# Patient Record
Sex: Male | Born: 1945 | Race: White | Hispanic: No | Marital: Married | Smoking: Former smoker
Health system: Southern US, Community
[De-identification: ages and names within clinical notes are randomized; demographics above are authoritative.]

## PROBLEM LIST (undated history)

## (undated) DIAGNOSIS — L409 Psoriasis, unspecified: Secondary | ICD-10-CM

## (undated) DIAGNOSIS — C4491 Basal cell carcinoma of skin, unspecified: Secondary | ICD-10-CM

## (undated) DIAGNOSIS — M1990 Unspecified osteoarthritis, unspecified site: Secondary | ICD-10-CM

## (undated) DIAGNOSIS — D649 Anemia, unspecified: Secondary | ICD-10-CM

## (undated) DIAGNOSIS — B029 Zoster without complications: Secondary | ICD-10-CM

## (undated) DIAGNOSIS — F419 Anxiety disorder, unspecified: Secondary | ICD-10-CM

## (undated) DIAGNOSIS — Z8619 Personal history of other infectious and parasitic diseases: Secondary | ICD-10-CM

## (undated) DIAGNOSIS — J310 Chronic rhinitis: Secondary | ICD-10-CM

## (undated) DIAGNOSIS — I10 Essential (primary) hypertension: Secondary | ICD-10-CM

## (undated) HISTORY — PX: OTHER SURGICAL HISTORY: SHX169

## (undated) HISTORY — PX: JOINT REPLACEMENT: SHX530

## (undated) HISTORY — PX: CARPAL TUNNEL RELEASE: SHX101

## (undated) HISTORY — PX: COLONOSCOPY: SHX174

## (undated) HISTORY — PX: RETINAL LASER PROCEDURE: SHX2339

## (undated) HISTORY — PX: CARDIAC CATHETERIZATION: SHX172

## (undated) HISTORY — PX: KNEE ARTHROSCOPY: SUR90

## (undated) HISTORY — PX: HAND SURGERY: SHX662

---

## 2003-10-01 ENCOUNTER — Other Ambulatory Visit: Payer: Self-pay

## 2007-05-23 ENCOUNTER — Ambulatory Visit: Payer: Self-pay | Admitting: Gastroenterology

## 2007-05-25 ENCOUNTER — Emergency Department: Payer: Self-pay | Admitting: Emergency Medicine

## 2007-05-25 ENCOUNTER — Other Ambulatory Visit: Payer: Self-pay

## 2013-11-20 ENCOUNTER — Ambulatory Visit: Payer: Self-pay | Admitting: Orthopedic Surgery

## 2014-01-07 ENCOUNTER — Ambulatory Visit: Payer: Self-pay | Admitting: Orthopedic Surgery

## 2014-01-07 LAB — MRSA PCR SCREENING

## 2014-01-17 ENCOUNTER — Inpatient Hospital Stay: Payer: Self-pay | Admitting: Orthopedic Surgery

## 2014-01-17 HISTORY — PX: JOINT REPLACEMENT: SHX530

## 2014-01-18 LAB — HEMOGLOBIN: HGB: 10.2 g/dL — ABNORMAL LOW (ref 13.0–18.0)

## 2014-01-18 LAB — BASIC METABOLIC PANEL
Anion Gap: 8 (ref 7–16)
BUN: 11 mg/dL (ref 7–18)
CREATININE: 0.92 mg/dL (ref 0.60–1.30)
Calcium, Total: 7.6 mg/dL — ABNORMAL LOW (ref 8.5–10.1)
Chloride: 99 mmol/L (ref 98–107)
Co2: 25 mmol/L (ref 21–32)
EGFR (African American): >60
GLUCOSE: 104 mg/dL — AB (ref 65–99)
Osmolality: 264 (ref 275–301)
Potassium: 4.1 mmol/L (ref 3.5–5.1)
Sodium: 132 mmol/L — ABNORMAL LOW (ref 136–145)

## 2014-01-18 LAB — PATHOLOGY REPORT

## 2014-01-18 LAB — PLATELET COUNT: Platelet: 169 10*3/uL (ref 150–440)

## 2014-01-20 ENCOUNTER — Encounter: Payer: Self-pay | Admitting: Internal Medicine

## 2014-02-03 ENCOUNTER — Encounter: Payer: Self-pay | Admitting: Internal Medicine

## 2014-03-05 ENCOUNTER — Encounter: Payer: Self-pay | Admitting: Internal Medicine

## 2014-04-05 ENCOUNTER — Encounter: Payer: Self-pay | Admitting: Internal Medicine

## 2014-07-27 NOTE — Discharge Summary (Signed)
PATIENT NAME:  Nathan Boone, Nathan Boone MR#:  [id number] DATE OF BIRTH:  May 22, 1945  DATE OF ADMISSION:  01/17/2014 DATE OF DISCHARGE:  01/21/2014  ADMITTING DIAGNOSIS: Degenerative arthritis, right knee.   DISCHARGE DIAGNOSES: Degenerative arthritis, right knee.   SURGERY: On 01/17/2014, had a right total knee arthroplasty.   SURGEON: Dr. Hessie Knows.   ANESTHESIA: Spinal.   ESTIMATED BLOOD LOSS: 200 mL.  TOURNIQUET TIME: 34 minutes at 300 mmHg.   DRAINS: None.   IMPLANTS: Medacta GMK sphere 5 femur, 4 tibia, 10 mm insert, 3 patella.   COMPLICATIONS: None.   HISTORY: After failing conservative measures and treatment for his right knee degenerative arthritis, he elected to proceed with a total knee replacement.   PHYSICAL EXAMINATION: GENERAL: Well-developed, well-nourished. No apparent distress, normal affect, normal gait with mild antalgic component.  HEENT: Head normocephalic, atraumatic. PERRL. ABDOMEN: Soft, nontender, nondistended, bowel sounds present.  HEART: Regular rate and rhythm. No murmurs. Apical pulse normal.  LUNGS: Clear to auscultation. No wheezes, rhonchi or crackles. Normal expansion bilateral chest walls.  RIGHT KNEE: Mild swelling. No warmth or edema. Previous incision site in medial aspect of the knee. No drainage. Mild effusion. Tender over medial lateral joint lines. Knee range of motion 0 to 90. Patella femoral crepitus. Pain with varus and valgus stress testing. Negative Homans. Neurovascularly intact.   HOSPITAL COURSE: After initial admission on 01/17/2014, the patient underwent total knee arthroplasty. He had good pain control afterwards and was transported to the orthopedic floor. On postoperative day one, 01/18/2014, hemoglobin was 10.2 and platelets 169,000. Vital signs were stable, although he was anxious. Physical therapy was begun on that day. On postoperative day two, 01/19/2014, no acute events. Slow progress with physical therapy. On postoperative day  three, 01/20/2014, bowel sounds continued to be stable. No labs drawn. On room air. Continued slow progress with physical therapy. Had a bowel movement. On postoperative day 4, he is stable and ready for discharge.   CONDITION AT DISCHARGE: Stable.   DISPOSITION: The patient was sent to skilled nursing facility.   DISCHARGE INSTRUCTIONS: Thigh-high TED hose bilaterally. Regular diet. Weight-bearing as tolerated on the right lower extremity. He will participate in physical therapy at rehab. Follow up with [facility] in 2 weeks for staple removal. Dressing should be kept on until follow-up appointment in 2 weeks.   DISCHARGE MEDICATIONS: Please see discharge instructions for complete list of discharge medications.  ____________________________ Trystin Terhune M. Tretha Sciara, NP amb:sb D: 01/21/2014 07:57:46 ET T: 01/21/2014 08:07:45 ET JOB#: [id number]  cc: Leiloni Smithers M. Tretha Sciara, NP, <Dictator> Kem Kays Marea Reasner FNP ELECTRONICALLY SIGNED 02/08/2014 11:56

## 2014-07-27 NOTE — Op Note (Signed)
PATIENT NAME:  Nathan Boone, Nathan Boone MR#:  [id number] DATE OF BIRTH:  01-15-1946  DATE OF PROCEDURE:  01/17/2014  PREOPERATIVE DIAGNOSIS: Severe right knee osteoarthritis, posttraumatic.   POSTOPERATIVE DIAGNOSIS: Severe right knee osteoarthritis, posttraumatic.   PROCEDURE: Right total knee replacement.   SURGEON: Hessie Knows, MD  ASSISTANT: April Berndt, NP  ANESTHESIA: Spinal.   DESCRIPTION OF PROCEDURE: The patient was brought to the operating room and after adequate anesthesia was obtained, the right leg was prepped and draped in the usual sterile fashion with a tourniquet applied to the upper thigh. At the start of the case appropriate patient identification and timeout procedures were completed. Range of motion at the start of the case was approximately 20 to 70 degrees. The patient had a prior anteromedial skin incision. This was utilized with extension proximal lateral and distal lateral making more of a curved incision. A thick flap was developed and the medial parapatellar arthrotomy was then performed. There was exposed bone in all compartments with complete loss of articular cartilage, loose bodies and extensive osteophytes noted. The anterior horns of the menisci were excised along with the anterior and posterior cruciate ligaments. The distal femur was exposed and the distal femoral cut was carried out first applying the Ray cutting guide, distal cut carried out and a 5 cutting guide applied. Anterior, posterior and chamfer cuts were performed. After this the proximal tibia was adequately exposed for application of the Keener cutting block and proximal tibia cut was carried out. The cuts matched the planned resections. The 4 tibial guide was then inserted after removing the posterior horns of the medial and lateral menisci. The tibia was pinned into place and proximal drill hole carried out followed by keel punch, femoral trial placed and with a 10 mm insert full extension could be  obtained with good stability in mid flexion. Distal femoral drill holes were made and the notch cut made for the femur. Next, the patella was cut using the patellar guide and after drill holes were made it measured a size 3. The tourniquet was not utilized during the surgery initially secondary to the large flap, also with the patient being a [employer] and not wanting transfusion and cautery could be used during the procedure to stop bleeding as it was noted. At this point, the knee was infiltrated with a mixture of morphine and Sensorcaine in the periarticular tissue along with Exparel diluted. The bony surfaces were thoroughly irrigated and dried. The tibial component was cemented into place first with the excess cement removed, the 10 mm insert snapped into place and set screw placed followed by cementing the femoral component with the knee held in extension. Patellar button was clamped into place as well. After the cement had set, excess cement was removed and the knee was again thoroughly irrigated. The arthrotomy was repaired using 0 Ethibond to tack down the medial side and then a running heavy Quill, 2-0 Quill subcutaneously, with the tourniquet let down followed by skin staples, Xeroform, 4 x 4's, ABD, Webril, Polar Care, Ace wrap and knee immobilizer applied. The patient was sent to the recovery room in stable condition.   ESTIMATED BLOOD LOSS: 200.  COMPLICATIONS: None.   SPECIMEN: Cut ends of bone.   TOURNIQUET TIME: 34 minutes at 300 mmHg.  IMPLANTS: Medacta GMK Sphere primary knee right, 4 right 10 mm flex insert with a 4 right fixed tibial component, a 5 right sphere component, and a size 3 primary patella. All components cemented.  CONDITION:  To recovery room stable. ____________________________ Laurene Footman, MD mjm:sb D: 01/17/2014 16:40:05 ET T: 01/17/2014 16:53:38 ET JOB#: [id number]  cc: Laurene Footman, MD, <Dictator> Laurene Footman MD ELECTRONICALLY SIGNED  01/17/2014 22:21

## 2017-02-01 ENCOUNTER — Other Ambulatory Visit: Payer: Self-pay | Admitting: Orthopedic Surgery

## 2017-02-01 DIAGNOSIS — M9933 Osseous stenosis of neural canal of lumbar region: Secondary | ICD-10-CM

## 2017-02-07 ENCOUNTER — Ambulatory Visit
Admission: RE | Admit: 2017-02-07 | Discharge: 2017-02-07 | Disposition: A | Discharge status: Home / Self Care | Payer: Medicare Other | Source: Ambulatory Visit | Attending: Orthopedic Surgery | Admitting: Orthopedic Surgery

## 2017-02-07 DIAGNOSIS — M5126 Other intervertebral disc displacement, lumbar region: Secondary | ICD-10-CM | POA: Diagnosis not present

## 2017-02-07 DIAGNOSIS — M8938 Hypertrophy of bone, other site: Secondary | ICD-10-CM | POA: Insufficient documentation

## 2017-02-07 DIAGNOSIS — M7138 Other bursal cyst, other site: Secondary | ICD-10-CM | POA: Diagnosis not present

## 2017-02-07 DIAGNOSIS — M9933 Osseous stenosis of neural canal of lumbar region: Secondary | ICD-10-CM | POA: Insufficient documentation

## 2017-02-07 DIAGNOSIS — M48061 Spinal stenosis, lumbar region without neurogenic claudication: Secondary | ICD-10-CM | POA: Diagnosis not present

## 2017-07-04 ENCOUNTER — Other Ambulatory Visit: Payer: Self-pay | Admitting: Orthopedic Surgery

## 2017-07-04 DIAGNOSIS — M23312 Other meniscus derangements, anterior horn of medial meniscus, left knee: Secondary | ICD-10-CM

## 2017-07-13 ENCOUNTER — Ambulatory Visit
Admission: RE | Admit: 2017-07-13 | Discharge: 2017-07-13 | Disposition: A | Discharge status: Home / Self Care | Payer: Medicare Other | Source: Ambulatory Visit | Attending: Orthopedic Surgery | Admitting: Orthopedic Surgery

## 2017-07-13 DIAGNOSIS — M1712 Unilateral primary osteoarthritis, left knee: Secondary | ICD-10-CM | POA: Diagnosis not present

## 2017-07-13 DIAGNOSIS — M23312 Other meniscus derangements, anterior horn of medial meniscus, left knee: Secondary | ICD-10-CM

## 2017-07-13 DIAGNOSIS — M8448XA Pathological fracture, other site, initial encounter for fracture: Secondary | ICD-10-CM | POA: Insufficient documentation

## 2017-08-11 ENCOUNTER — Other Ambulatory Visit: Payer: Self-pay

## 2017-08-11 ENCOUNTER — Encounter
Admission: RE | Admit: 2017-08-11 | Discharge: 2017-08-11 | Disposition: A | Discharge status: Home / Self Care | Payer: Medicare Other | Source: Ambulatory Visit | Attending: Orthopedic Surgery | Admitting: Orthopedic Surgery

## 2017-08-11 DIAGNOSIS — I10 Essential (primary) hypertension: Secondary | ICD-10-CM | POA: Insufficient documentation

## 2017-08-11 DIAGNOSIS — R001 Bradycardia, unspecified: Secondary | ICD-10-CM | POA: Insufficient documentation

## 2017-08-11 DIAGNOSIS — Z01818 Encounter for other preprocedural examination: Secondary | ICD-10-CM | POA: Diagnosis present

## 2017-08-11 DIAGNOSIS — Z01812 Encounter for preprocedural laboratory examination: Secondary | ICD-10-CM | POA: Insufficient documentation

## 2017-08-11 HISTORY — DX: Anemia, unspecified: D64.9

## 2017-08-11 HISTORY — DX: Essential (primary) hypertension: I10

## 2017-08-11 HISTORY — DX: Psoriasis, unspecified: L40.9

## 2017-08-11 HISTORY — DX: Anxiety disorder, unspecified: F41.9

## 2017-08-11 HISTORY — DX: Chronic rhinitis: J31.0

## 2017-08-11 LAB — BASIC METABOLIC PANEL
Anion gap: 6 (ref 5–15)
BUN: 17 mg/dL (ref 6–20)
CALCIUM: 9.2 mg/dL (ref 8.9–10.3)
CHLORIDE: 109 mmol/L (ref 101–111)
CO2: 26 mmol/L (ref 22–32)
CREATININE: 0.98 mg/dL (ref 0.61–1.24)
GFR calc non Af Amer: >60 mL/min (ref >60)
Glucose, Bld: 105 mg/dL — ABNORMAL HIGH (ref 65–99)
Potassium: 4 mmol/L (ref 3.5–5.1)
Sodium: 141 mmol/L (ref 135–145)

## 2017-08-11 LAB — CBC
HCT: 38.3 % — ABNORMAL LOW (ref 40.0–52.0)
HEMOGLOBIN: 13.2 g/dL (ref 13.0–18.0)
MCH: 32 pg (ref 26.0–34.0)
MCHC: 34.3 g/dL (ref 32.0–36.0)
MCV: 93.1 fL (ref 80.0–100.0)
Platelets: 197 10*3/uL (ref 150–440)
RBC: 4.12 MIL/uL — ABNORMAL LOW (ref 4.40–5.90)
RDW: 13.8 % (ref 11.5–14.5)
WBC: 4.5 10*3/uL (ref 3.8–10.6)

## 2017-08-11 NOTE — Patient Instructions (Signed)
Your procedure is scheduled on: Aug 18, 2017 THURSDAY Report to Day Surgery on the 2nd floor of the [facility]. To find out your arrival time, please call [phone] between 1PM - 3PM on: Wednesday Aug 17, 2017  REMEMBER: Instructions that are not followed completely may result in serious medical risk, up to and including death; or upon the discretion of your surgeon and anesthesiologist your surgery may need to be rescheduled.  Do not eat food after midnight the night before your procedure.  No gum chewing, lozengers or hard candies.  You may however, drink CLEAR liquids up to 2 hours before you are scheduled to arrive for your surgery. Do not drink anything within 2 hours of the start of your surgery.  Clear liquids include: - water  - apple juice without pulp - clear gatorade - black coffee or tea (Do NOT add anything to the coffee or tea) Do NOT drink anything that is not on this list.    No Alcohol for 24 hours before or after surgery.  No Smoking including e-cigarettes for 24 hours prior to surgery.  No chewable tobacco products for at least 6 hours prior to surgery.  No nicotine patches on the day of surgery.  On the morning of surgery brush your teeth with toothpaste and water, you may rinse your mouth with mouthwash if you wish. Do not swallow any toothpaste or mouthwash.  Notify your doctor if there is any change in your medical condition (cold, fever, infection).  Do not wear jewelry, make-up, hairpins, clips or nail polish.  Do not wear lotions, powders, or perfumes. You may wear deodorant.  Do not shave 48 hours prior to surgery. Men may shave face and neck.  Contacts and dentures may not be worn into surgery.  Do not bring valuables to the hospital, including drivers license, insurance or credit cards.  [facility] is not responsible for any belongings or valuables.   TAKE THESE MEDICATIONS THE MORNING OF SURGERY: METOPROLOL HYTRIN  Use CHG Soap or  wipes as directed on instruction sheet.  Use FLONASE on the day of surgery .  Follow recommendations from Cardiologist, Pulmonologist or PCP regarding stopping Aspirin, Coumadin, Plavix, Eliquis, Pradaxa, or Pletal.  Stop Anti-inflammatories (NSAIDS) such as Advil, Aleve, Ibuprofen, Motrin, Naproxen, Naprosyn and Aspirin based products such as Excedrin, Goodys Powder, BC Powder. (May take Tylenol or Acetaminophen if needed.)  Stop ANY OVER THE COUNTER supplements until after surgery. (May continue Vitamin D, Vitamin B, and multivitamin.)  Wear comfortable clothing (specific to your surgery type) to the hospital.  Plan for stool softeners for home use.   If you are being discharged the day of surgery, you will not be allowed to drive home. You will need a responsible adult to drive you home and stay with you that night.   If you are taking public transportation, you will need to have a responsible adult with you. Please confirm with your physician that it is acceptable to use public transportation.   Please call [phone] if you have any questions about these instructions.

## 2017-08-18 ENCOUNTER — Ambulatory Visit: Payer: Medicare Other | Admitting: Certified Registered Nurse Anesthetist

## 2017-08-18 ENCOUNTER — Encounter: Payer: Self-pay | Admitting: *Deleted

## 2017-08-18 ENCOUNTER — Ambulatory Visit
Admission: RE | Admit: 2017-08-18 | Discharge: 2017-08-18 | Disposition: A | Discharge status: Home / Self Care | Payer: Medicare Other | Source: Ambulatory Visit | Attending: Orthopedic Surgery | Admitting: Orthopedic Surgery

## 2017-08-18 ENCOUNTER — Encounter
Admission: RE | Disposition: A | Discharge status: Home / Self Care | Payer: Self-pay | Source: Ambulatory Visit | Attending: Orthopedic Surgery

## 2017-08-18 DIAGNOSIS — F419 Anxiety disorder, unspecified: Secondary | ICD-10-CM | POA: Diagnosis not present

## 2017-08-18 DIAGNOSIS — D649 Anemia, unspecified: Secondary | ICD-10-CM | POA: Insufficient documentation

## 2017-08-18 DIAGNOSIS — X58XXXA Exposure to other specified factors, initial encounter: Secondary | ICD-10-CM | POA: Insufficient documentation

## 2017-08-18 DIAGNOSIS — Y929 Unspecified place or not applicable: Secondary | ICD-10-CM | POA: Diagnosis not present

## 2017-08-18 DIAGNOSIS — L4050 Arthropathic psoriasis, unspecified: Secondary | ICD-10-CM | POA: Diagnosis not present

## 2017-08-18 DIAGNOSIS — S83242A Other tear of medial meniscus, current injury, left knee, initial encounter: Secondary | ICD-10-CM | POA: Diagnosis present

## 2017-08-18 DIAGNOSIS — Z79899 Other long term (current) drug therapy: Secondary | ICD-10-CM | POA: Insufficient documentation

## 2017-08-18 DIAGNOSIS — S83282A Other tear of lateral meniscus, current injury, left knee, initial encounter: Secondary | ICD-10-CM | POA: Diagnosis not present

## 2017-08-18 DIAGNOSIS — I10 Essential (primary) hypertension: Secondary | ICD-10-CM | POA: Insufficient documentation

## 2017-08-18 HISTORY — PX: KNEE ARTHROSCOPY WITH MEDIAL MENISECTOMY: SHX5651

## 2017-08-18 SURGERY — ARTHROSCOPY, KNEE, WITH MEDIAL MENISCECTOMY
Anesthesia: General | Site: Knee | Laterality: Left | Wound class: "Clean "

## 2017-08-18 MED ORDER — ONDANSETRON HCL 4 MG/2ML IJ SOLN: 4.0000 mg | Freq: Once | INTRAMUSCULAR | Status: DC | PRN

## 2017-08-18 MED ORDER — FAMOTIDINE 20 MG PO TABS
ORAL_TABLET | ORAL | Status: AC
  Administered 2017-08-18: 20 mg via ORAL
  Filled 2017-08-18: qty 1

## 2017-08-18 MED ORDER — DEXAMETHASONE SODIUM PHOSPHATE 10 MG/ML IJ SOLN
INTRAMUSCULAR | Status: AC
  Filled 2017-08-18: qty 1

## 2017-08-18 MED ORDER — HYDROCODONE-ACETAMINOPHEN 5-325 MG PO TABS
ORAL_TABLET | ORAL | Status: AC
  Filled 2017-08-18: qty 2

## 2017-08-18 MED ORDER — FENTANYL CITRATE (PF) 100 MCG/2ML IJ SOLN
INTRAMUSCULAR | Status: AC
  Administered 2017-08-18: 25 ug via INTRAVENOUS
  Filled 2017-08-18: qty 2

## 2017-08-18 MED ORDER — BUPIVACAINE-EPINEPHRINE (PF) 0.5% -1:200000 IJ SOLN
INTRAMUSCULAR | Status: AC
  Filled 2017-08-18: qty 30

## 2017-08-18 MED ORDER — FENTANYL CITRATE (PF) 100 MCG/2ML IJ SOLN
25.0000 ug | INTRAMUSCULAR | Status: DC | PRN
  Administered 2017-08-18 (×4): 25 ug via INTRAVENOUS

## 2017-08-18 MED ORDER — PROPOFOL 10 MG/ML IV BOLUS
INTRAVENOUS | Status: DC | PRN
  Administered 2017-08-18: 180 mg via INTRAVENOUS

## 2017-08-18 MED ORDER — ONDANSETRON HCL 4 MG PO TABS: 4.0000 mg | ORAL_TABLET | Freq: Four times a day (QID) | ORAL | Status: DC | PRN

## 2017-08-18 MED ORDER — MIDAZOLAM HCL 2 MG/2ML IJ SOLN
INTRAMUSCULAR | Status: AC
  Filled 2017-08-18: qty 2

## 2017-08-18 MED ORDER — METOCLOPRAMIDE HCL 10 MG PO TABS: 5.0000 mg | ORAL_TABLET | Freq: Three times a day (TID) | ORAL | Status: DC | PRN

## 2017-08-18 MED ORDER — FENTANYL CITRATE (PF) 100 MCG/2ML IJ SOLN
INTRAMUSCULAR | Status: AC
  Filled 2017-08-18: qty 2

## 2017-08-18 MED ORDER — GLYCOPYRROLATE 0.2 MG/ML IJ SOLN
INTRAMUSCULAR | Status: DC | PRN
  Administered 2017-08-18: .2 mg via INTRAVENOUS

## 2017-08-18 MED ORDER — BUPIVACAINE-EPINEPHRINE (PF) 0.5% -1:200000 IJ SOLN
INTRAMUSCULAR | Status: DC | PRN
  Administered 2017-08-18: 20 mL

## 2017-08-18 MED ORDER — SODIUM CHLORIDE 0.9 % IV SOLN: INTRAVENOUS | Status: DC

## 2017-08-18 MED ORDER — LIDOCAINE HCL (CARDIAC) PF 100 MG/5ML IV SOSY
PREFILLED_SYRINGE | INTRAVENOUS | Status: DC | PRN
  Administered 2017-08-18: 60 mg via INTRAVENOUS

## 2017-08-18 MED ORDER — DEXAMETHASONE SODIUM PHOSPHATE 10 MG/ML IJ SOLN
INTRAMUSCULAR | Status: DC | PRN
  Administered 2017-08-18: 5 mg via INTRAVENOUS

## 2017-08-18 MED ORDER — METOCLOPRAMIDE HCL 5 MG/ML IJ SOLN: 5.0000 mg | Freq: Three times a day (TID) | INTRAMUSCULAR | Status: DC | PRN

## 2017-08-18 MED ORDER — LACTATED RINGERS IV SOLN
INTRAVENOUS | Status: DC
  Administered 2017-08-18: 08:00:00 via INTRAVENOUS

## 2017-08-18 MED ORDER — GLYCOPYRROLATE 0.2 MG/ML IJ SOLN
INTRAMUSCULAR | Status: AC
  Filled 2017-08-18: qty 1

## 2017-08-18 MED ORDER — PROPOFOL 10 MG/ML IV BOLUS
INTRAVENOUS | Status: AC
  Filled 2017-08-18: qty 20

## 2017-08-18 MED ORDER — MIDAZOLAM HCL 2 MG/2ML IJ SOLN
INTRAMUSCULAR | Status: DC | PRN
  Administered 2017-08-18: 1 mg via INTRAVENOUS

## 2017-08-18 MED ORDER — ONDANSETRON HCL 4 MG/2ML IJ SOLN: 4.0000 mg | Freq: Four times a day (QID) | INTRAMUSCULAR | Status: DC | PRN

## 2017-08-18 MED ORDER — LIDOCAINE HCL (PF) 2 % IJ SOLN
INTRAMUSCULAR | Status: AC
  Filled 2017-08-18: qty 10

## 2017-08-18 MED ORDER — ONDANSETRON HCL 4 MG/2ML IJ SOLN
INTRAMUSCULAR | Status: DC | PRN
  Administered 2017-08-18: 4 mg via INTRAVENOUS

## 2017-08-18 MED ORDER — HYDROCODONE-ACETAMINOPHEN 5-325 MG PO TABS
1.0000 | ORAL_TABLET | ORAL | Status: DC | PRN
  Administered 2017-08-18: 2 via ORAL

## 2017-08-18 MED ORDER — ONDANSETRON HCL 4 MG/2ML IJ SOLN
INTRAMUSCULAR | Status: AC
  Filled 2017-08-18: qty 2

## 2017-08-18 MED ORDER — FAMOTIDINE 20 MG PO TABS
20.0000 mg | ORAL_TABLET | Freq: Once | ORAL | Status: AC
  Administered 2017-08-18: 20 mg via ORAL

## 2017-08-18 MED ORDER — FENTANYL CITRATE (PF) 100 MCG/2ML IJ SOLN
INTRAMUSCULAR | Status: DC | PRN
  Administered 2017-08-18 (×2): 25 ug via INTRAVENOUS
  Administered 2017-08-18: 50 ug via INTRAVENOUS

## 2017-08-18 SURGICAL SUPPLY — 25 items
BANDAGE ACE 4X5 VEL STRL LF (GAUZE/BANDAGES/DRESSINGS) IMPLANT
BLADE INCISOR PLUS 4.5 (BLADE) IMPLANT
CHLORAPREP W/TINT 26ML (MISCELLANEOUS) ×2 IMPLANT
CUFF TOURN 24 STER (MISCELLANEOUS) IMPLANT
CUFF TOURN 30 STER DUAL PORT (MISCELLANEOUS) IMPLANT
GAUZE SPONGE 4X4 12PLY STRL (GAUZE/BANDAGES/DRESSINGS) ×2 IMPLANT
GLOVE SURG SYN 9.0  PF PI (GLOVE) ×1
GLOVE SURG SYN 9.0 PF PI (GLOVE) ×1 IMPLANT
GOWN SRG 2XL LVL 4 RGLN SLV (GOWNS) ×1 IMPLANT
GOWN STRL NON-REIN 2XL LVL4 (GOWNS) ×1
GOWN STRL REUS W/ TWL LRG LVL3 (GOWN DISPOSABLE) ×2 IMPLANT
GOWN STRL REUS W/TWL LRG LVL3 (GOWN DISPOSABLE) ×2
IV LACTATED RINGER IRRG 3000ML (IV SOLUTION) ×3
IV LR IRRIG 3000ML ARTHROMATIC (IV SOLUTION) ×2 IMPLANT
KIT TURNOVER KIT A (KITS) ×2 IMPLANT
MANIFOLD NEPTUNE II (INSTRUMENTS) ×2 IMPLANT
PACK ARTHROSCOPY KNEE (MISCELLANEOUS) ×2 IMPLANT
SCALPEL PROTECTED #11 DISP (BLADE) ×2 IMPLANT
SET TUBE SUCT SHAVER OUTFL 24K (TUBING) ×2 IMPLANT
SET TUBE TIP INTRA-ARTICULAR (MISCELLANEOUS) ×2 IMPLANT
SUT ETHILON 4-0 (SUTURE) ×1
SUT ETHILON 4-0 FS2 18XMFL BLK (SUTURE) ×1
SUTURE ETHLN 4-0 FS2 18XMF BLK (SUTURE) ×1 IMPLANT
TUBING ARTHRO INFLOW-ONLY STRL (TUBING) ×2 IMPLANT
WAND COBLATION FLOW 50 (SURGICAL WAND) ×2 IMPLANT

## 2017-08-18 NOTE — H&P (Signed)
Reviewed paper H+P, will be scanned into chart. No changes noted.  

## 2017-08-18 NOTE — Discharge Instructions (Addendum)
Keep dressing clean and dry.  Try to minimize activities through the weekend.  Ice to knee as needed.  Pain medicine as previously directed  (Weight bearing as tolerated to your operative extremity per MD)  AMBULATORY SURGERY  DISCHARGE INSTRUCTIONS   1) The drugs that you were given will stay in your system until tomorrow so for the next 24 hours you should not:  A) Drive an automobile B) Make any legal decisions C) Drink any alcoholic beverage   2) You may resume regular meals tomorrow.  Today it is better to start with liquids and gradually work up to solid foods.  You may eat anything you prefer, but it is better to start with liquids, then soup and crackers, and gradually work up to solid foods.   3) Please notify your doctor immediately if you have any unusual bleeding, trouble breathing, redness and pain at the surgery site, drainage, fever, or pain not relieved by medication.    4) Additional Instructions:        Please contact your physician with any problems or Same Day Surgery at [phone], Monday through Friday 6 am to 4 pm, or  at [facility] number at [phone].

## 2017-08-18 NOTE — Anesthesia Procedure Notes (Signed)
Procedure Name: LMA Insertion Date/Time: 08/18/2017 8:30 AM Performed by: Allean Found, CRNA Pre-anesthesia Checklist: Patient identified, Emergency Drugs available, Suction available, Patient being monitored and Timeout performed Patient Re-evaluated:Patient Re-evaluated prior to induction Oxygen Delivery Method: Circle system utilized Preoxygenation: Pre-oxygenation with 100% oxygen Induction Type: IV induction Ventilation: Mask ventilation without difficulty LMA: LMA inserted LMA Size: 5.0 Number of attempts: 2 Placement Confirmation: positive ETCO2 and breath sounds checked- equal and bilateral Tube secured with: Tape Dental Injury: Teeth and Oropharynx as per pre-operative assessment

## 2017-08-18 NOTE — Anesthesia Post-op Follow-up Note (Signed)
Anesthesia QCDR form completed.        

## 2017-08-18 NOTE — Anesthesia Preprocedure Evaluation (Signed)
Anesthesia Evaluation  Patient identified by MRN, date of birth, ID band Patient awake    Reviewed: Allergy & Precautions, NPO status , Patient's Chart, lab work & pertinent test results  Airway Mallampati: II  TM Distance: >3 FB     Dental   Pulmonary former smoker,    Pulmonary exam normal        Cardiovascular hypertension, Normal cardiovascular exam     Neuro/Psych Anxiety negative neurological ROS     GI/Hepatic negative GI ROS, Neg liver ROS,   Endo/Other  negative endocrine ROS  Renal/GU negative Renal ROS  negative genitourinary   Musculoskeletal   Abdominal Normal abdominal exam  (+)   Peds negative pediatric ROS (+)  Hematology  (+) anemia ,   Anesthesia Other Findings   Reproductive/Obstetrics                             Anesthesia Physical Anesthesia Plan  ASA: II  Anesthesia Plan: General   Post-op Pain Management:    Induction: Intravenous  PONV Risk Score and Plan:   Airway Management Planned: LMA  Additional Equipment:   Intra-op Plan:   Post-operative Plan: Extubation in OR  Informed Consent: I have reviewed the patients History and Physical, chart, labs and discussed the procedure including the risks, benefits and alternatives for the proposed anesthesia with the patient or authorized representative who has indicated his/her understanding and acceptance.   Dental advisory given  Plan Discussed with: CRNA and Surgeon  Anesthesia Plan Comments:         Anesthesia Quick Evaluation

## 2017-08-18 NOTE — Op Note (Signed)
08/18/2017  9:20 AM  PATIENT:  Nathan Boone  72 y.o. male  PRE-OPERATIVE DIAGNOSIS:  peripheral tear of medial meniscus of left knee, lateral meniscus tear  POST-OPERATIVE DIAGNOSIS:  peripheral tear of medial meniscus of left knee lateral meniscus tear and synovitis  PROCEDURE:  Procedure(s): KNEE ARTHROSCOPY WITH PARTIAL MEDIAL AND LATERAL MENISECTOMY WITH PARTIAL SYNOVECTOMY (Left)  SURGEON: Laurene Footman, MD  ASSISTANTS: None  ANESTHESIA:   general  EBL:  Total I/O In: 500 [I.V.:500] Out: 5 [Blood:5]  BLOOD ADMINISTERED:none  DRAINS: none   LOCAL MEDICATIONS USED:  MARCAINE     SPECIMEN:  No Specimen  DISPOSITION OF SPECIMEN:  N/A  COUNTS:  YES  TOURNIQUET:   Total Tourniquet Time Documented: Thigh (Left) - 14 minutes Total: Thigh (Left) - 14 minutes   IMPLANTS: None  DICTATION: .Dragon Dictation brought to the operating room and after adequate anesthesia was obtained the left leg was prepped and draped in sterile fashion tourniquet applied along with leg holder.  After appropriate patient identification and timeout procedure completed, an inferolateral portal was made and arthroscope introduced with a large effusion present.  Initial inspection was remarkable for extensive synovitis at the patellofemoral joint.  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  Shaver by wand was used to address this to stable margins.  The ACL appeared intact.  Going to the lateral compartment there is an anterior horn tear as well as peripheral tears in the mid middle third this again debrided initially with a  shaver and ArthroCare wand to get back to stable margins there is moderate osteoarthritis in the lateral compartment.  After addressing the meniscal pathology adequately the synovitis was debrided with use of the shaver to get rid of any of the patellofemoral joint after this was completed the knee was irrigated until clear and ultra mentation withdrawn with the tourniquet elevated after part of the initial synovectomy for visualization was caused bleeding turned down to close the case the wounds were closed with simple 4-0 nylon with 20 cc of half percent Sensorcaine infiltrated the portals Xeroform 4 x 4 web roll and Ace wrap applied  PLAN OF CARE: Discharge to home after PACU  PATIENT DISPOSITION:  PACU - hemodynamically stable.

## 2017-08-18 NOTE — Transfer of Care (Signed)
Immediate Anesthesia Transfer of Care Note  Patient: Nathan Boone  Procedure(s) Performed: KNEE ARTHROSCOPY WITH PARTIAL MEDIAL AND LATERAL MENISECTOMY WITH PARTIAL SYNOVECTOMY (Left Knee)  Patient Location: PACU  Anesthesia Type:General  Level of Consciousness: awake, alert , oriented and patient cooperative  Airway & Oxygen Therapy: Patient Spontanous Breathing and Patient connected to face mask oxygen  Post-op Assessment: Report given to RN and Post -op Vital signs reviewed and stable  Post vital signs: Reviewed and stable  Last Vitals:  Vitals Value Taken Time  BP 175/83 08/18/2017  9:19 AM  Temp    Pulse 58 08/18/2017  9:22 AM  Resp 11 08/18/2017  9:22 AM  SpO2 100 % 08/18/2017  9:22 AM  Vitals shown include unvalidated device data.  Last Pain:  Vitals:   08/18/17 0723  PainSc: 1          Complications: No apparent anesthesia complications

## 2017-08-22 NOTE — Anesthesia Postprocedure Evaluation (Signed)
Anesthesia Post Note  Patient: Nathan Boone  Procedure(s) Performed: KNEE ARTHROSCOPY WITH PARTIAL MEDIAL AND LATERAL MENISECTOMY WITH PARTIAL SYNOVECTOMY (Left Knee)  Patient location during evaluation: PACU Anesthesia Type: General Level of consciousness: awake and alert and oriented Pain management: pain level controlled Vital Signs Assessment: post-procedure vital signs reviewed and stable Respiratory status: spontaneous breathing Cardiovascular status: blood pressure returned to baseline Anesthetic complications: no     Last Vitals:  Vitals:   08/18/17 1045 08/18/17 1106  BP: (!) 175/89 (!) 174/77  Pulse: 60 (!) 51  Resp: 14 16  Temp: (!) 36.3 C (!) 36.3 C  SpO2: 99% 99%    Last Pain:  Vitals:   08/19/17 0847  TempSrc:   PainSc: 1                  [name]

## 2020-06-11 HISTORY — PX: CATARACT EXTRACTION W/ INTRAOCULAR LENS IMPLANT: SHX1309

## 2020-06-25 HISTORY — PX: CATARACT EXTRACTION W/ INTRAOCULAR LENS IMPLANT: SHX1309

## 2020-09-05 ENCOUNTER — Other Ambulatory Visit: Payer: Self-pay | Admitting: Orthopedic Surgery

## 2020-09-05 DIAGNOSIS — M1712 Unilateral primary osteoarthritis, left knee: Secondary | ICD-10-CM

## 2020-09-25 ENCOUNTER — Other Ambulatory Visit: Payer: Self-pay

## 2020-09-25 ENCOUNTER — Ambulatory Visit
Admission: RE | Admit: 2020-09-25 | Discharge: 2020-09-25 | Disposition: A | Discharge status: Home / Self Care | Payer: Medicare Other | Source: Ambulatory Visit | Attending: Orthopedic Surgery | Admitting: Orthopedic Surgery

## 2020-09-25 DIAGNOSIS — M1712 Unilateral primary osteoarthritis, left knee: Secondary | ICD-10-CM | POA: Diagnosis not present

## 2020-10-14 ENCOUNTER — Other Ambulatory Visit: Payer: Self-pay | Admitting: Orthopedic Surgery

## 2020-11-03 ENCOUNTER — Other Ambulatory Visit: Payer: Self-pay

## 2020-11-03 ENCOUNTER — Encounter
Admission: RE | Admit: 2020-11-03 | Discharge: 2020-11-03 | Disposition: A | Discharge status: Home / Self Care | Payer: Medicare Other | Source: Ambulatory Visit | Attending: Orthopedic Surgery | Admitting: Orthopedic Surgery

## 2020-11-03 DIAGNOSIS — Z01818 Encounter for other preprocedural examination: Secondary | ICD-10-CM | POA: Diagnosis not present

## 2020-11-03 HISTORY — DX: Basal cell carcinoma of skin, unspecified: C44.91

## 2020-11-03 HISTORY — DX: Unspecified osteoarthritis, unspecified site: M19.90

## 2020-11-03 HISTORY — DX: Personal history of other infectious and parasitic diseases: Z86.19

## 2020-11-03 LAB — URINALYSIS, ROUTINE W REFLEX MICROSCOPIC
Bilirubin Urine: NEGATIVE
Glucose, UA: NEGATIVE mg/dL
Hgb urine dipstick: NEGATIVE
Ketones, ur: NEGATIVE mg/dL
Leukocytes,Ua: NEGATIVE
Nitrite: NEGATIVE
Protein, ur: NEGATIVE mg/dL
Specific Gravity, Urine: 1.017 (ref 1.005–1.030)
pH: 5 (ref 5.0–8.0)

## 2020-11-03 LAB — COMPREHENSIVE METABOLIC PANEL
ALT: 18 U/L (ref 0–44)
AST: 19 U/L (ref 15–41)
Albumin: 4.2 g/dL (ref 3.5–5.0)
Alkaline Phosphatase: 68 U/L (ref 38–126)
Anion gap: 11 (ref 5–15)
BUN: 20 mg/dL (ref 8–23)
CO2: 26 mmol/L (ref 22–32)
Calcium: 9 mg/dL (ref 8.9–10.3)
Chloride: 103 mmol/L (ref 98–111)
Creatinine, Ser: 1.31 mg/dL — ABNORMAL HIGH (ref 0.61–1.24)
GFR, Estimated: 57 mL/min — ABNORMAL LOW (ref >60)
Glucose, Bld: 101 mg/dL — ABNORMAL HIGH (ref 70–99)
Potassium: 3.8 mmol/L (ref 3.5–5.1)
Sodium: 140 mmol/L (ref 135–145)
Total Bilirubin: 0.8 mg/dL (ref 0.3–1.2)
Total Protein: 7.3 g/dL (ref 6.5–8.1)

## 2020-11-03 LAB — CBC WITH DIFFERENTIAL/PLATELET
Abs Immature Granulocytes: 0.01 10*3/uL (ref 0.00–0.07)
Basophils Absolute: 0 10*3/uL (ref 0.0–0.1)
Basophils Relative: 1 %
Eosinophils Absolute: 0.1 10*3/uL (ref 0.0–0.5)
Eosinophils Relative: 2 %
HCT: 37 % — ABNORMAL LOW (ref 39.0–52.0)
Hemoglobin: 12.7 g/dL — ABNORMAL LOW (ref 13.0–17.0)
Immature Granulocytes: 0 %
Lymphocytes Relative: 29 %
Lymphs Abs: 1.6 10*3/uL (ref 0.7–4.0)
MCH: 32.5 pg (ref 26.0–34.0)
MCHC: 34.3 g/dL (ref 30.0–36.0)
MCV: 94.6 fL (ref 80.0–100.0)
Monocytes Absolute: 0.4 10*3/uL (ref 0.1–1.0)
Monocytes Relative: 8 %
Neutro Abs: 3.3 10*3/uL (ref 1.7–7.7)
Neutrophils Relative %: 60 %
Platelets: 155 10*3/uL (ref 150–400)
RBC: 3.91 MIL/uL — ABNORMAL LOW (ref 4.22–5.81)
RDW: 13.1 % (ref 11.5–15.5)
WBC: 5.5 10*3/uL (ref 4.0–10.5)
nRBC: 0 % (ref 0.0–0.2)

## 2020-11-03 LAB — SURGICAL PCR SCREEN
MRSA, PCR: NEGATIVE
Staphylococcus aureus: POSITIVE — AB

## 2020-11-03 NOTE — Patient Instructions (Signed)
Your procedure is scheduled on: Thursday, August 11 Report to the Registration Desk on the 1st floor of the [facility]. To find out your arrival time, please call [phone] between 1PM - 3PM on: Wednesday, August 10  REMEMBER: Instructions that are not followed completely may result in serious medical risk, up to and including death; or upon the discretion of your surgeon and anesthesiologist your surgery may need to be rescheduled.  Do not eat food after midnight the night before surgery.  No gum chewing, lozengers or hard candies.  You may however, drink CLEAR liquids up to 2 hours before you are scheduled to arrive for your surgery. Do not drink anything within 2 hours of your scheduled arrival time.  Clear liquids include: - water  - apple juice without pulp - gatorade (not RED, PURPLE, OR BLUE) - black coffee or tea (Do NOT add milk or creamers to the coffee or tea) Do NOT drink anything that is not on this list.  In addition, your doctor has ordered for you to drink the provided  Ensure Pre-Surgery Clear Carbohydrate Drink  Drinking this carbohydrate drink up to two hours before surgery helps to reduce insulin resistance and improve patient outcomes. Please complete drinking 2 hours prior to scheduled arrival time.  TAKE THESE MEDICATIONS THE MORNING OF SURGERY WITH A SIP OF WATER:  Metoprolol Terazosin  One week prior to surgery: starting August 4 Stop Anti-inflammatories (NSAIDS) such as Advil, Aleve, Ibuprofen, Motrin, Naproxen, Naprosyn and Aspirin based products such as Excedrin, Goodys Powder, BC Powder. Stop ANY OVER THE COUNTER supplements until after surgery. (Melatonin) You may however, continue to take Tylenol if needed for pain up until the day of surgery.  No Alcohol for 24 hours before or after surgery.  On the morning of surgery brush your teeth with toothpaste and water, you may rinse your mouth with mouthwash if you wish. Do not swallow any toothpaste  or mouthwash.  Do not wear jewelry, make-up, hairpins, clips or nail polish.  Do not wear lotions, powders, or perfumes.   Do not shave body from the neck down 48 hours prior to surgery just in case you cut yourself which could leave a site for infection.  Also, freshly shaved skin may become irritated if using the CHG soap.  Contact lenses, hearing aids and dentures may not be worn into surgery.  Do not bring valuables to the hospital. [facility] is not responsible for any missing/lost belongings or valuables.   Use CHG Soap as directed on instruction sheet.  Notify your doctor if there is any change in your medical condition (cold, fever, infection).  Wear comfortable clothing (specific to your surgery type) to the hospital.  After surgery, you can help prevent lung complications by doing breathing exercises.  Take deep breaths and cough every 1-2 hours. Your doctor may order a device called an Incentive Spirometer to help you take deep breaths.  If you are being admitted to the hospital overnight, leave your suitcase in the car. After surgery it may be brought to your room.  Please call the [facility] at [phone] if you have any questions about these instructions.  Surgery Visitation Policy:  Patients undergoing a surgery or procedure may have one family member or support person with them as long as that person is not COVID-19 positive or experiencing its symptoms.  That person may remain in the waiting area during the procedure.  Inpatient Visitation:    Visiting hours are  7 a.m. to 8 p.m. Inpatients will be allowed two visitors daily. The visitors may change each day during the patient's stay. No visitors under the age of 75. Any visitor under the age of 73 must be accompanied by an adult. The visitor must pass COVID-19 screenings, use hand sanitizer when entering and exiting the patient's room and wear a mask at all times, including in the patient's  room. Patients must also wear a mask when staff or their visitor are in the room. Masking is required regardless of vaccination status.

## 2020-11-10 ENCOUNTER — Other Ambulatory Visit: Payer: Self-pay

## 2020-11-10 ENCOUNTER — Other Ambulatory Visit
Admission: RE | Admit: 2020-11-10 | Discharge: 2020-11-10 | Disposition: A | Discharge status: Home / Self Care | Payer: Medicare Other | Source: Ambulatory Visit | Attending: Orthopedic Surgery | Admitting: Orthopedic Surgery

## 2020-11-10 DIAGNOSIS — Z01812 Encounter for preprocedural laboratory examination: Secondary | ICD-10-CM | POA: Insufficient documentation

## 2020-11-10 DIAGNOSIS — Z20822 Contact with and (suspected) exposure to covid-19: Secondary | ICD-10-CM | POA: Diagnosis not present

## 2020-11-10 LAB — SARS CORONAVIRUS 2 (TAT 6-24 HRS): SARS Coronavirus 2: NEGATIVE

## 2020-11-11 ENCOUNTER — Other Ambulatory Visit: Admission: RE | Admit: 2020-11-11 | Payer: Medicare Other | Source: Ambulatory Visit

## 2020-11-12 NOTE — Anesthesia Preprocedure Evaluation (Addendum)
Anesthesia Evaluation  Patient identified by MRN, date of birth, ID band Patient awake    Reviewed: Allergy & Precautions, NPO status , Patient's Chart, lab work & pertinent test results  Airway Mallampati: III  TM Distance: >3 FB     Dental  (+) Missing,    Pulmonary former smoker,    Pulmonary exam normal        Cardiovascular Exercise Tolerance: Good hypertension, Normal cardiovascular exam  Left Ventricular Hypertrophy   Neuro/Psych Anxiety negative neurological ROS     GI/Hepatic negative GI ROS, Neg liver ROS,   Endo/Other  negative endocrine ROS  Renal/GU negative Renal ROS  negative genitourinary   Musculoskeletal  (+) Arthritis , Osteoarthritis,    Abdominal Normal abdominal exam  (+)   Peds negative pediatric ROS (+)  Hematology  (+) anemia , REFUSES BLOOD PRODUCTS,   Anesthesia Other Findings   Reproductive/Obstetrics                            Anesthesia Physical  Anesthesia Plan  ASA: 2  Anesthesia Plan: Spinal   Post-op Pain Management:    Induction: Intravenous  PONV Risk Score and Plan: 2 and Propofol infusion and Treatment may vary due to age or medical condition  Airway Management Planned: Simple Face Mask and Natural Airway  Additional Equipment:   Intra-op Plan:   Post-operative Plan:   Informed Consent: I have reviewed the patients History and Physical, chart, labs and discussed the procedure including the risks, benefits and alternatives for the proposed anesthesia with the patient or authorized representative who has indicated his/her understanding and acceptance.     Dental advisory given  Plan Discussed with: CRNA and Anesthesiologist  Anesthesia Plan Comments:        Anesthesia Quick Evaluation

## 2020-11-13 ENCOUNTER — Ambulatory Visit: Payer: Medicare Other | Admitting: Anesthesiology

## 2020-11-13 ENCOUNTER — Other Ambulatory Visit: Payer: Self-pay

## 2020-11-13 ENCOUNTER — Encounter: Payer: Self-pay | Admitting: Orthopedic Surgery

## 2020-11-13 ENCOUNTER — Inpatient Hospital Stay
Admission: AD | Admit: 2020-11-13 | Discharge: 2020-11-17 | DRG: 470 | Disposition: A | Discharge status: Home Health | Payer: Medicare Other | Attending: Orthopedic Surgery | Admitting: Orthopedic Surgery

## 2020-11-13 ENCOUNTER — Observation Stay: Payer: Medicare Other

## 2020-11-13 ENCOUNTER — Encounter
Admission: AD | Disposition: A | Discharge status: Home Health | Payer: Self-pay | Source: Home / Self Care | Attending: Orthopedic Surgery

## 2020-11-13 DIAGNOSIS — Z85828 Personal history of other malignant neoplasm of skin: Secondary | ICD-10-CM

## 2020-11-13 DIAGNOSIS — I10 Essential (primary) hypertension: Secondary | ICD-10-CM | POA: Diagnosis present

## 2020-11-13 DIAGNOSIS — M1712 Unilateral primary osteoarthritis, left knee: Principal | ICD-10-CM | POA: Diagnosis present

## 2020-11-13 DIAGNOSIS — Z809 Family history of malignant neoplasm, unspecified: Secondary | ICD-10-CM

## 2020-11-13 DIAGNOSIS — Z8619 Personal history of other infectious and parasitic diseases: Secondary | ICD-10-CM

## 2020-11-13 DIAGNOSIS — Z79899 Other long term (current) drug therapy: Secondary | ICD-10-CM

## 2020-11-13 DIAGNOSIS — Z96651 Presence of right artificial knee joint: Secondary | ICD-10-CM | POA: Diagnosis present

## 2020-11-13 DIAGNOSIS — L4050 Arthropathic psoriasis, unspecified: Secondary | ICD-10-CM | POA: Diagnosis present

## 2020-11-13 DIAGNOSIS — Z841 Family history of disorders of kidney and ureter: Secondary | ICD-10-CM

## 2020-11-13 DIAGNOSIS — G8918 Other acute postprocedural pain: Secondary | ICD-10-CM

## 2020-11-13 DIAGNOSIS — Z821 Family history of blindness and visual loss: Secondary | ICD-10-CM

## 2020-11-13 DIAGNOSIS — Z8249 Family history of ischemic heart disease and other diseases of the circulatory system: Secondary | ICD-10-CM

## 2020-11-13 DIAGNOSIS — Z96652 Presence of left artificial knee joint: Secondary | ICD-10-CM

## 2020-11-13 DIAGNOSIS — F419 Anxiety disorder, unspecified: Secondary | ICD-10-CM | POA: Diagnosis present

## 2020-11-13 HISTORY — PX: TOTAL KNEE ARTHROPLASTY: SHX125

## 2020-11-13 LAB — CBC
HCT: 31.3 % — ABNORMAL LOW (ref 39.0–52.0)
Hemoglobin: 10.9 g/dL — ABNORMAL LOW (ref 13.0–17.0)
MCH: 33.1 pg (ref 26.0–34.0)
MCHC: 34.8 g/dL (ref 30.0–36.0)
MCV: 95.1 fL (ref 80.0–100.0)
Platelets: 160 10*3/uL (ref 150–400)
RBC: 3.29 MIL/uL — ABNORMAL LOW (ref 4.22–5.81)
RDW: 12.9 % (ref 11.5–15.5)
WBC: 5.6 10*3/uL (ref 4.0–10.5)
nRBC: 0 % (ref 0.0–0.2)

## 2020-11-13 LAB — CREATININE, SERUM
Creatinine, Ser: 0.96 mg/dL (ref 0.61–1.24)
GFR, Estimated: >60 mL/min (ref >60)

## 2020-11-13 LAB — GLUCOSE, CAPILLARY: Glucose-Capillary: 147 mg/dL — ABNORMAL HIGH (ref 70–99)

## 2020-11-13 SURGERY — ARTHROPLASTY, KNEE, TOTAL
Anesthesia: Spinal | Site: Knee | Laterality: Left

## 2020-11-13 MED ORDER — SURGIPHOR WOUND IRRIGATION SYSTEM - OPTIME
TOPICAL | Status: DC | PRN
  Administered 2020-11-13: 1 via TOPICAL

## 2020-11-13 MED ORDER — ACETAMINOPHEN 10 MG/ML IV SOLN
INTRAVENOUS | Status: AC
  Filled 2020-11-13: qty 100

## 2020-11-13 MED ORDER — TERAZOSIN HCL 5 MG PO CAPS
10.0000 mg | ORAL_CAPSULE | Freq: Every day | ORAL | Status: DC
  Administered 2020-11-14 – 2020-11-17 (×4): 10 mg via ORAL
  Filled 2020-11-13 (×4): qty 2

## 2020-11-13 MED ORDER — MORPHINE SULFATE (PF) 2 MG/ML IV SOLN
0.5000 mg | INTRAVENOUS | Status: DC | PRN
  Administered 2020-11-13: 1 mg via INTRAVENOUS
  Filled 2020-11-13: qty 1

## 2020-11-13 MED ORDER — POLYETHYLENE GLYCOL 3350 17 G PO PACK
17.0000 g | PACK | Freq: Every day | ORAL | Status: DC | PRN
  Administered 2020-11-14: 17 g via ORAL
  Filled 2020-11-13 (×2): qty 1

## 2020-11-13 MED ORDER — TRANEXAMIC ACID-NACL 1000-0.7 MG/100ML-% IV SOLN
1000.0000 mg | INTRAVENOUS | Status: AC
  Administered 2020-11-13: 1000 mg via INTRAVENOUS

## 2020-11-13 MED ORDER — ALUM & MAG HYDROXIDE-SIMETH 200-200-20 MG/5ML PO SUSP: 30.0000 mL | ORAL | Status: DC | PRN

## 2020-11-13 MED ORDER — CHLORHEXIDINE GLUCONATE 0.12 % MT SOLN: 15.0000 mL | Freq: Once | OROMUCOSAL | Status: AC

## 2020-11-13 MED ORDER — ONDANSETRON HCL 4 MG PO TABS
4.0000 mg | ORAL_TABLET | Freq: Four times a day (QID) | ORAL | Status: DC | PRN
  Administered 2020-11-13: 4 mg via ORAL
  Filled 2020-11-13: qty 1

## 2020-11-13 MED ORDER — SODIUM CHLORIDE 0.9 % IR SOLN
Status: DC | PRN
  Administered 2020-11-13: 500 mL
  Administered 2020-11-13: 3000 mL

## 2020-11-13 MED ORDER — METHOCARBAMOL 1000 MG/10ML IJ SOLN
500.0000 mg | Freq: Four times a day (QID) | INTRAVENOUS | Status: DC | PRN
  Filled 2020-11-13: qty 5

## 2020-11-13 MED ORDER — OXYCODONE HCL 5 MG/5ML PO SOLN: 5.0000 mg | Freq: Once | ORAL | Status: DC | PRN

## 2020-11-13 MED ORDER — BUPIVACAINE HCL (PF) 0.5 % IJ SOLN
INTRAMUSCULAR | Status: DC | PRN
  Administered 2020-11-13: 2.5 mL

## 2020-11-13 MED ORDER — MIDAZOLAM HCL 2 MG/2ML IJ SOLN
INTRAMUSCULAR | Status: AC
  Filled 2020-11-13: qty 2

## 2020-11-13 MED ORDER — PROPOFOL 1000 MG/100ML IV EMUL
INTRAVENOUS | Status: AC
  Filled 2020-11-13: qty 100

## 2020-11-13 MED ORDER — CEFAZOLIN SODIUM-DEXTROSE 2-4 GM/100ML-% IV SOLN
2.0000 g | Freq: Four times a day (QID) | INTRAVENOUS | Status: AC
  Administered 2020-11-13: 2 g via INTRAVENOUS
  Filled 2020-11-13 (×2): qty 100

## 2020-11-13 MED ORDER — ONDANSETRON HCL 4 MG/2ML IJ SOLN: 4.0000 mg | Freq: Four times a day (QID) | INTRAMUSCULAR | Status: DC | PRN

## 2020-11-13 MED ORDER — TRIAMCINOLONE ACETONIDE 0.1 % EX CREA
1.0000 "application " | TOPICAL_CREAM | Freq: Every day | CUTANEOUS | Status: DC | PRN
  Filled 2020-11-13: qty 15

## 2020-11-13 MED ORDER — FLUTICASONE PROPIONATE 50 MCG/ACT NA SUSP
1.0000 | Freq: Every day | NASAL | Status: DC
  Administered 2020-11-16 – 2020-11-17 (×2): 1 via NASAL
  Filled 2020-11-13: qty 16

## 2020-11-13 MED ORDER — HYDROCODONE-ACETAMINOPHEN 7.5-325 MG PO TABS
1.0000 | ORAL_TABLET | ORAL | Status: DC | PRN
  Administered 2020-11-14 (×2): 2 via ORAL
  Administered 2020-11-14: 1 via ORAL
  Administered 2020-11-14 – 2020-11-15 (×4): 2 via ORAL
  Administered 2020-11-15: 1 via ORAL
  Administered 2020-11-16 – 2020-11-17 (×2): 2 via ORAL
  Filled 2020-11-13: qty 1
  Filled 2020-11-13 (×9): qty 2

## 2020-11-13 MED ORDER — MELATONIN 5 MG PO TABS
10.0000 mg | ORAL_TABLET | Freq: Every day | ORAL | Status: DC
  Administered 2020-11-13 – 2020-11-16 (×4): 10 mg via ORAL
  Filled 2020-11-13 (×4): qty 2

## 2020-11-13 MED ORDER — NEOMYCIN-POLYMYXIN B GU 40-200000 IR SOLN
Status: AC
  Filled 2020-11-13: qty 20

## 2020-11-13 MED ORDER — FENTANYL CITRATE (PF) 100 MCG/2ML IJ SOLN: 25.0000 ug | INTRAMUSCULAR | Status: DC | PRN

## 2020-11-13 MED ORDER — NEOMYCIN-POLYMYXIN B GU 40-200000 IR SOLN
Status: DC | PRN
  Administered 2020-11-13: 14 mL

## 2020-11-13 MED ORDER — DOCUSATE SODIUM 100 MG PO CAPS
100.0000 mg | ORAL_CAPSULE | Freq: Two times a day (BID) | ORAL | Status: DC
  Administered 2020-11-13 – 2020-11-17 (×9): 100 mg via ORAL
  Filled 2020-11-13 (×9): qty 1

## 2020-11-13 MED ORDER — PHENYLEPHRINE HCL (PRESSORS) 10 MG/ML IV SOLN
INTRAVENOUS | Status: AC
  Filled 2020-11-13: qty 1

## 2020-11-13 MED ORDER — TRANEXAMIC ACID-NACL 1000-0.7 MG/100ML-% IV SOLN
INTRAVENOUS | Status: AC
  Filled 2020-11-13: qty 100

## 2020-11-13 MED ORDER — PROPOFOL 500 MG/50ML IV EMUL
INTRAVENOUS | Status: DC | PRN
  Administered 2020-11-13: 75 ug/kg/min via INTRAVENOUS

## 2020-11-13 MED ORDER — 0.9 % SODIUM CHLORIDE (POUR BTL) OPTIME
TOPICAL | Status: DC | PRN
  Administered 2020-11-13: 1000 mL

## 2020-11-13 MED ORDER — MENTHOL 3 MG MT LOZG
1.0000 | LOZENGE | OROMUCOSAL | Status: DC | PRN
  Filled 2020-11-13: qty 9

## 2020-11-13 MED ORDER — LACTATED RINGERS IV SOLN: INTRAVENOUS | Status: DC

## 2020-11-13 MED ORDER — ORAL CARE MOUTH RINSE: 15.0000 mL | Freq: Once | OROMUCOSAL | Status: AC

## 2020-11-13 MED ORDER — PHENOL 1.4 % MT LIQD
1.0000 | OROMUCOSAL | Status: DC | PRN
  Filled 2020-11-13: qty 177

## 2020-11-13 MED ORDER — PROMETHAZINE HCL 25 MG/ML IJ SOLN
6.2500 mg | INTRAMUSCULAR | Status: DC | PRN
Start: 2020-11-13 — End: 2020-11-13

## 2020-11-13 MED ORDER — FAMOTIDINE 20 MG PO TABS: 20.0000 mg | ORAL_TABLET | Freq: Once | ORAL | Status: AC

## 2020-11-13 MED ORDER — FAMOTIDINE 20 MG PO TABS
ORAL_TABLET | ORAL | Status: AC
  Administered 2020-11-13: 20 mg via ORAL
  Filled 2020-11-13: qty 1

## 2020-11-13 MED ORDER — BISACODYL 10 MG RE SUPP: 10.0000 mg | Freq: Every day | RECTAL | Status: DC | PRN

## 2020-11-13 MED ORDER — ZOLPIDEM TARTRATE 5 MG PO TABS: 5.0000 mg | ORAL_TABLET | Freq: Every evening | ORAL | Status: DC | PRN

## 2020-11-13 MED ORDER — DIPHENHYDRAMINE HCL 12.5 MG/5ML PO ELIX: 12.5000 mg | ORAL_SOLUTION | ORAL | Status: DC | PRN

## 2020-11-13 MED ORDER — SEVOFLURANE IN SOLN
RESPIRATORY_TRACT | Status: AC
  Filled 2020-11-13: qty 250

## 2020-11-13 MED ORDER — ACETAMINOPHEN 325 MG PO TABS: 325.0000 mg | ORAL_TABLET | Freq: Four times a day (QID) | ORAL | Status: DC | PRN

## 2020-11-13 MED ORDER — SODIUM CHLORIDE 0.9 % IV SOLN: INTRAVENOUS | Status: DC

## 2020-11-13 MED ORDER — ENOXAPARIN SODIUM 30 MG/0.3ML IJ SOSY
30.0000 mg | PREFILLED_SYRINGE | Freq: Two times a day (BID) | INTRAMUSCULAR | Status: DC
  Administered 2020-11-14 – 2020-11-17 (×7): 30 mg via SUBCUTANEOUS
  Filled 2020-11-13 (×7): qty 0.3

## 2020-11-13 MED ORDER — METHOCARBAMOL 500 MG PO TABS
500.0000 mg | ORAL_TABLET | Freq: Four times a day (QID) | ORAL | Status: DC | PRN
  Administered 2020-11-13 – 2020-11-16 (×5): 500 mg via ORAL
  Filled 2020-11-13 (×6): qty 1

## 2020-11-13 MED ORDER — BUPIVACAINE-EPINEPHRINE (PF) 0.25% -1:200000 IJ SOLN
INTRAMUSCULAR | Status: AC
  Filled 2020-11-13: qty 30

## 2020-11-13 MED ORDER — GLYCOPYRROLATE 0.2 MG/ML IJ SOLN
INTRAMUSCULAR | Status: DC | PRN
  Administered 2020-11-13: .2 mg via INTRAVENOUS

## 2020-11-13 MED ORDER — DROPERIDOL 2.5 MG/ML IJ SOLN
0.6250 mg | Freq: Once | INTRAMUSCULAR | Status: DC | PRN
Start: 2020-11-13 — End: 2020-11-13
  Filled 2020-11-13: qty 2

## 2020-11-13 MED ORDER — CEFAZOLIN SODIUM-DEXTROSE 2-4 GM/100ML-% IV SOLN
INTRAVENOUS | Status: AC
  Administered 2020-11-13: 2 g via INTRAVENOUS
  Filled 2020-11-13: qty 100

## 2020-11-13 MED ORDER — OXYCODONE HCL 5 MG PO TABS
5.0000 mg | ORAL_TABLET | Freq: Once | ORAL | Status: DC | PRN
Start: 2020-11-13 — End: 2020-11-13

## 2020-11-13 MED ORDER — ACETAMINOPHEN 10 MG/ML IV SOLN
INTRAVENOUS | Status: DC | PRN
  Administered 2020-11-13: 1000 mg via INTRAVENOUS

## 2020-11-13 MED ORDER — TRAMADOL HCL 50 MG PO TABS
50.0000 mg | ORAL_TABLET | Freq: Four times a day (QID) | ORAL | Status: DC
  Administered 2020-11-13 – 2020-11-16 (×5): 50 mg via ORAL
  Filled 2020-11-13 (×6): qty 1

## 2020-11-13 MED ORDER — CEFAZOLIN SODIUM-DEXTROSE 2-4 GM/100ML-% IV SOLN
2.0000 g | INTRAVENOUS | Status: AC
  Administered 2020-11-13: 2 g via INTRAVENOUS

## 2020-11-13 MED ORDER — METOPROLOL SUCCINATE ER 25 MG PO TB24
25.0000 mg | ORAL_TABLET | Freq: Every day | ORAL | Status: DC
  Administered 2020-11-14 – 2020-11-17 (×4): 25 mg via ORAL
  Filled 2020-11-13 (×4): qty 1

## 2020-11-13 MED ORDER — ACETAMINOPHEN 10 MG/ML IV SOLN: 1000.0000 mg | Freq: Once | INTRAVENOUS | Status: DC | PRN

## 2020-11-13 MED ORDER — MORPHINE SULFATE (PF) 10 MG/ML IV SOLN
INTRAVENOUS | Status: AC
  Filled 2020-11-13: qty 1

## 2020-11-13 MED ORDER — SODIUM CHLORIDE FLUSH 0.9 % IV SOLN
INTRAVENOUS | Status: AC
  Filled 2020-11-13: qty 40

## 2020-11-13 MED ORDER — SODIUM CHLORIDE (PF) 0.9 % IJ SOLN: INTRAMUSCULAR | Status: DC | PRN

## 2020-11-13 MED ORDER — PANTOPRAZOLE SODIUM 40 MG PO TBEC
40.0000 mg | DELAYED_RELEASE_TABLET | Freq: Every day | ORAL | Status: DC
  Administered 2020-11-13 – 2020-11-17 (×5): 40 mg via ORAL
  Filled 2020-11-13 (×5): qty 1

## 2020-11-13 MED ORDER — FE FUMARATE-B12-VIT C-FA-IFC PO CAPS
1.0000 | ORAL_CAPSULE | Freq: Two times a day (BID) | ORAL | Status: DC
  Administered 2020-11-13 – 2020-11-17 (×6): 1 via ORAL
  Filled 2020-11-13 (×9): qty 1

## 2020-11-13 MED ORDER — FLEET ENEMA 7-19 GM/118ML RE ENEM: 1.0000 | ENEMA | Freq: Once | RECTAL | Status: DC | PRN

## 2020-11-13 MED ORDER — MIDAZOLAM HCL 5 MG/5ML IJ SOLN
INTRAMUSCULAR | Status: DC | PRN
  Administered 2020-11-13: 2 mg via INTRAVENOUS

## 2020-11-13 MED ORDER — CHLORHEXIDINE GLUCONATE 0.12 % MT SOLN
OROMUCOSAL | Status: AC
  Administered 2020-11-13: 15 mL via OROMUCOSAL
  Filled 2020-11-13: qty 15

## 2020-11-13 MED ORDER — HYDROCODONE-ACETAMINOPHEN 5-325 MG PO TABS
1.0000 | ORAL_TABLET | ORAL | Status: DC | PRN
  Administered 2020-11-13 (×2): 2 via ORAL
  Administered 2020-11-14 – 2020-11-16 (×2): 1 via ORAL
  Administered 2020-11-16 (×2): 2 via ORAL
  Filled 2020-11-13 (×2): qty 2
  Filled 2020-11-13: qty 1
  Filled 2020-11-13 (×2): qty 2
  Filled 2020-11-13: qty 1

## 2020-11-13 MED ORDER — BUPIVACAINE LIPOSOME 1.3 % IJ SUSP
INTRAMUSCULAR | Status: AC
  Filled 2020-11-13: qty 20

## 2020-11-13 SURGICAL SUPPLY — 75 items
BLADE SAGITTAL 25.0X1.19X90 (BLADE) ×2 IMPLANT
BLADE SAW 90X13X1.19 OSCILLAT (BLADE) IMPLANT
BLOCK CUTTING FEMUR 4 LT MED (MISCELLANEOUS) ×2 IMPLANT
BLOCK CUTTING TIBIAL 4 LT CT (MISCELLANEOUS) ×2 IMPLANT
BNDG ELASTIC 6X5.8 VLCR STR LF (GAUZE/BANDAGES/DRESSINGS) ×2 IMPLANT
CANISTER SUCT 1200ML W/VALVE (MISCELLANEOUS) ×2 IMPLANT
CANISTER WOUND CARE 500ML ATS (WOUND CARE) ×2 IMPLANT
CEMENT HV SMART SET (Cement) ×4 IMPLANT
CHLORAPREP W/TINT 26 (MISCELLANEOUS) ×4 IMPLANT
COOLER POLAR GLACIER W/PUMP (MISCELLANEOUS) ×4 IMPLANT
CUFF TOURN SGL QUICK 24 (TOURNIQUET CUFF) ×1
CUFF TOURN SGL QUICK 34 (TOURNIQUET CUFF)
CUFF TRNQT CYL 24X4X16.5-23 (TOURNIQUET CUFF) ×1 IMPLANT
CUFF TRNQT CYL 34X4.125X (TOURNIQUET CUFF) IMPLANT
DRAPE 3/4 80X56 (DRAPES) ×4 IMPLANT
DRSG MEPILEX SACRM 8.7X9.8 (GAUZE/BANDAGES/DRESSINGS) ×2 IMPLANT
ELECT CAUTERY BLADE 6.4 (BLADE) IMPLANT
ELECT REM PT RETURN 9FT ADLT (ELECTROSURGICAL) ×2
ELECTRODE REM PT RTRN 9FT ADLT (ELECTROSURGICAL) ×1 IMPLANT
FEMORAL COMP SZ4 LEFT SPHERE (Femur) ×2 IMPLANT
FEMUR BONE MODEL 4.9010 MEDACT (MISCELLANEOUS) ×2 IMPLANT
GAUZE 4X4 16PLY ~~LOC~~+RFID DBL (SPONGE) ×2 IMPLANT
GAUZE SPONGE 4X4 12PLY STRL (GAUZE/BANDAGES/DRESSINGS) ×2 IMPLANT
GAUZE XEROFORM 1X8 LF (GAUZE/BANDAGES/DRESSINGS) IMPLANT
GLOVE SURG ORTHO LTX SZ8 (GLOVE) ×2 IMPLANT
GLOVE SURG SYN 9.0  PF PI (GLOVE) ×1
GLOVE SURG SYN 9.0 PF PI (GLOVE) ×1 IMPLANT
GLOVE SURG UNDER LTX SZ8 (GLOVE) ×2 IMPLANT
GLOVE SURG UNDER POLY LF SZ9 (GLOVE) ×2 IMPLANT
GOWN SRG 2XL LVL 4 RGLN SLV (GOWNS) ×1 IMPLANT
GOWN STRL NON-REIN 2XL LVL4 (GOWNS) ×1
GOWN STRL REUS W/ TWL LRG LVL3 (GOWN DISPOSABLE) ×1 IMPLANT
GOWN STRL REUS W/ TWL XL LVL3 (GOWN DISPOSABLE) ×1 IMPLANT
GOWN STRL REUS W/TWL LRG LVL3 (GOWN DISPOSABLE) ×1
GOWN STRL REUS W/TWL XL LVL3 (GOWN DISPOSABLE) ×1
HOLDER FOLEY CATH W/STRAP (MISCELLANEOUS) ×2 IMPLANT
HOOD PEEL AWAY FLYTE STAYCOOL (MISCELLANEOUS) ×4 IMPLANT
INSERT TIBIAL SZ4 LEFT (Insert) ×2 IMPLANT
IRRIGATION SURGIPHOR STRL (IV SOLUTION) ×2 IMPLANT
IV NS IRRIG 3000ML ARTHROMATIC (IV SOLUTION) ×2 IMPLANT
IV SODIUM CHL 0.9% 500ML (IV SOLUTION) ×2 IMPLANT
KIT PREVENA INCISION MGT20CM45 (CANNISTER) ×2 IMPLANT
KIT TURNOVER KIT A (KITS) ×2 IMPLANT
MANIFOLD NEPTUNE II (INSTRUMENTS) ×4 IMPLANT
NDL SAFETY ECLIPSE 18X1.5 (NEEDLE) ×1 IMPLANT
NEEDLE HYPO 18GX1.5 SHARP (NEEDLE) ×1
NEEDLE SPNL 18GX3.5 QUINCKE PK (NEEDLE) ×2 IMPLANT
NEEDLE SPNL 20GX3.5 QUINCKE YW (NEEDLE) ×2 IMPLANT
NS IRRIG 1000ML POUR BTL (IV SOLUTION) ×2 IMPLANT
PACK TOTAL KNEE (MISCELLANEOUS) ×2 IMPLANT
PAD WRAPON POLAR KNEE (MISCELLANEOUS) ×1 IMPLANT
PATELLA RESURFACING MEDACTA SZ (Bone Implant) ×2 IMPLANT
PENCIL SMOKE EVACUATOR COATED (MISCELLANEOUS) ×2 IMPLANT
PULSAVAC PLUS IRRIG FAN TIP (DISPOSABLE) ×2
SCALPEL PROTECTED #10 DISP (BLADE) ×4 IMPLANT
SPONGE T-LAP 18X18 ~~LOC~~+RFID (SPONGE) ×6 IMPLANT
STAPLER SKIN PROX 35W (STAPLE) ×2 IMPLANT
STEM EXTENSION 11MMX30MM (Stem) ×2 IMPLANT
SUCTION FRAZIER HANDLE 10FR (MISCELLANEOUS) ×1
SUCTION TUBE FRAZIER 10FR DISP (MISCELLANEOUS) ×1 IMPLANT
SUT BONE WAX W31G (SUTURE) ×2 IMPLANT
SUT DVC 2 QUILL PDO  T11 36X36 (SUTURE) ×1
SUT DVC 2 QUILL PDO T11 36X36 (SUTURE) ×1 IMPLANT
SUT ETHIBOND 2 V 37 (SUTURE) IMPLANT
SUT V-LOC 90 ABS DVC 3-0 CL (SUTURE) ×2 IMPLANT
SYR 20ML LL LF (SYRINGE) ×2 IMPLANT
SYR 50ML LL SCALE MARK (SYRINGE) ×4 IMPLANT
TIBIAL BONE MODEL LEFT (MISCELLANEOUS) ×2 IMPLANT
TIBIAL TRAY FIXED MEDACTA 0207 (Bone Implant) ×2 IMPLANT
TIP FAN IRRIG PULSAVAC PLUS (DISPOSABLE) ×1 IMPLANT
TOWEL OR 17X26 4PK STRL BLUE (TOWEL DISPOSABLE) ×2 IMPLANT
TOWER CARTRIDGE SMART MIX (DISPOSABLE) ×2 IMPLANT
TRAY FOLEY MTR SLVR 16FR STAT (SET/KITS/TRAYS/PACK) ×2 IMPLANT
WAND WEREWOLF FASTSEAL 6.0 (MISCELLANEOUS) ×2 IMPLANT
WRAPON POLAR PAD KNEE (MISCELLANEOUS) ×2

## 2020-11-13 NOTE — Op Note (Signed)
11/13/2020  9:34 AM  PATIENT:  Nathan Boone   MRN: [id number]  PRE-OPERATIVE DIAGNOSIS:  Primary localized osteoarthritis of left knee   POST-OPERATIVE DIAGNOSIS:  Same   PROCEDURE:  Procedure(s): Left TOTAL KNEE ARTHROPLASTY   SURGEON: Laurene Footman, MD   ASSISTANTS: Rachelle Hora, PA-C   ANESTHESIA:   spinal   EBL: 200   BLOOD ADMINISTERED:none   DRAINS:  Incisional wound VAC     LOCAL MEDICATIONS USED:  MARCAINE    and OTHER Exparel and morphine   SPECIMEN:  No Specimen   DISPOSITION OF SPECIMEN:  N/A   COUNTS:  YES   TOURNIQUET: 25 minutes at 300 mm Hg   IMPLANTS: Medacta  GMK sphere system with for left femur, 4 left tibia with short stem and 10 mm insert.  Size 3 patella, all components cemented.   DICTATION: Viviann Spare Dictation   patient was brought to the operating room and spinal anesthesia was obtained.  After prepping and draping the left leg in sterile fashion, and after patient identification and timeout procedures were completed,midline skin incision was made followed by medial parapatellar arthrotomy with severe medial compartment osteoarthritis, severe patellofemoral arthritis and severe lateral compartment arthritis, partial synovectomy was also carried out.   The ACL and PCL and fat pad were excised along with anterior horns of the meniscus. The proximal tibia cutting guide from  the [facility] system was applied and the proximal tibia cut carried out.  The distal femoral cut was carried out in a similar fashion     The for femoral cutting guide applied with anterior posterior and chamfer cuts made.  The posterior horns of the menisci were removed at this point.   Injection of the above medication was carried out after the femoral and tibial cuts were carried out.  The 4 baseplate trial was placed pinned into position and proximal tibial preparation carried out with drilling hand reaming and the keel punch followed by placement of the 4 femur and sizing the tibial  insert size 10 millimeter gave the best fit with stability and full extension.  The distal femoral drill holes were made in the notch cut for the trochlear groove was then carried out with trials were then removed the patella was cut using the patellar cutting guide and it sized to a size 3 after drill holes have been made up to this point was procedure tourniquet been let down to stop bleeding during the procedure.  Floseal was also used to minimize bleeding as the patient is Job witness to prevent need for postop transfusion.  Tourniquet was raised at this point.  The knee was irrigated with pulsatile lavage and the bony surfaces dried the tibial component was cemented into place first.  Excess cement was removed and the polyethylene insert placed with a torque screw placed with a torque screwdriver tightened.  The distal femoral component was placed and the knee was held in extension as the patellar button was clamped into place.  After the cement was set, excess cement was removed and the knee was again irrigated thoroughly thoroughly irrigated.  The tourniquet was let down and hemostasis checked with electrocautery.  Additionally bone wax was applied to any exposed bony surfaces to minimize postop blood loss.  The arthrotomy was repaired with a heavy Quill suture,  followed by 3-0 V lock subcuticular closure, skin staples followed by incisional wound VAC and Polar Care.Marland Kitchen   PLAN OF CARE: Admit for overnight observation   PATIENT DISPOSITION:  PACU - hemodynamically stable.

## 2020-11-13 NOTE — Anesthesia Procedure Notes (Signed)
Spinal  Patient location during procedure: OR Start time: 11/13/2020 7:18 AM End time: 11/13/2020 7:35 AM Reason for block: surgical anesthesia Staffing Performed: anesthesiologist  Anesthesiologist: Iran Ouch, MD Preanesthetic Checklist Completed: patient identified, IV checked, site marked, risks and benefits discussed, surgical consent, monitors and equipment checked, pre-op evaluation and timeout performed Spinal Block Patient position: sitting Prep: ChloraPrep Patient monitoring: continuous pulse ox, blood pressure and heart rate Approach: midline Location: L2-3 Injection technique: single-shot Needle Needle type: Pencan  Needle gauge: 24 G Needle length: 10 cm Assessment Sensory level: T4 Events: CSF return Additional Notes 1st attempt by CRNA Mina Marble w/o success, Dr. Wynetta Emery w/2nd attempt able to get positive CSF return, Pt tolerated procedure throughout

## 2020-11-13 NOTE — Transfer of Care (Signed)
Immediate Anesthesia Transfer of Care Note  Patient: Nathan Boone  Procedure(s) Performed: TOTAL KNEE ARTHROPLASTY (Left: Knee)  Patient Location: PACU  Anesthesia Type:Spinal  Level of Consciousness: awake, alert  and oriented  Airway & Oxygen Therapy: Patient Spontanous Breathing and Patient connected to face mask oxygen  Post-op Assessment: Report given to RN and Post -op Vital signs reviewed and stable  Post vital signs: Reviewed and stable  Last Vitals:  Vitals Value Taken Time  BP 111/61 11/13/20 0933  Temp    Pulse 62 11/13/20 0933  Resp 19 11/13/20 0933  SpO2 99 % 11/13/20 0933  Vitals shown include unvalidated device data.  Last Pain:  Vitals:   11/13/20 0933  TempSrc:   PainSc: 0-No pain         Complications: No notable events documented.

## 2020-11-13 NOTE — Anesthesia Postprocedure Evaluation (Signed)
Anesthesia Post Note  Patient: Nathan Boone  Procedure(s) Performed: TOTAL KNEE ARTHROPLASTY (Left: Knee)  Patient location during evaluation: PACU Anesthesia Type: Spinal Level of consciousness: oriented and awake and alert Pain management: pain level controlled Vital Signs Assessment: post-procedure vital signs reviewed and stable Respiratory status: spontaneous breathing, respiratory function stable and nonlabored ventilation Cardiovascular status: blood pressure returned to baseline and stable Postop Assessment: no headache, no backache and no apparent nausea or vomiting Anesthetic complications: no   No notable events documented.   Last Vitals:  Vitals:   11/13/20 1045 11/13/20 1130  BP: 101/60 137/72  Pulse: (!) 53 (!) 52  Resp: 13 16  Temp: 36.6 C 36.4 C  SpO2: 100% 100%    Last Pain:  Vitals:   11/13/20 1130  TempSrc:   PainSc: 0-No pain                 Iran Ouch

## 2020-11-13 NOTE — H&P (Signed)
No chief complaint on file.   History of the Present Illness: Nathan Boone is a 75 y.o. male here today for history and physical for left total knee arthroplasty with Dr. Hessie Knows on 11/13/2020. He has left knee severe osteoarthritis with prior x-rays. He has had injections in the past with very little relief. His left knee is very stiff, painful and interferes with quality of life and activities daily living. He has a hard time standing due to inability to flex the knee. His knee will swell, buckle and give way. He has pain mostly along the medial joint line where his x-rays show near complete loss of joint space in the medial compartment. Patient is seen Dr. Rudene Christians and agreed and consented to a left total knee arthroplasty.  Patient is a Sales promotion account executive Witness  I have reviewed past medical, surgical, social and family history, and allergies as documented in the EMR.  Past Medical History: Past Medical History:  Diagnosis Date   Allergic rhinitis   Anemia   Anxiety   Essential hypertension, benign   History of shingles   Osteoarthritis   Psoriasis  Dr. Sharlett Iles   Psoriatic arthritis (CMS-HCC)   Renal insufficiency   Past Surgical History: Past Surgical History:  Procedure Laterality Date   Basel Cell Carcinoma Excision Left Cheek 10/2013   COLONOSCOPY   EXTRACTION CATARACT EXTRACAPSULAR W/INSERTION INTRAOCULAR PROSTHESIS Left 06/11/2020  Procedure: Left Lensx, ORA - EXTRACTION CATARACT EXTRACAPSULAR WITH PHACO WITH INSERTION INTRAOCULAR PROSTHESIS; Surgeon: Netta Corrigan, MD; Location: [facility]; Service: Ophthalmology; Laterality: Left;   EXTRACTION CATARACT EXTRACAPSULAR W/INSERTION INTRAOCULAR PROSTHESIS Right 06/25/2020  Procedure: Right Lensx/ORA EXTRACTION CATARACT EXTRACAPSULAR WITH PHACO WITH INSERTION INTRAOCULAR PROSTHESIS; Surgeon: Netta Corrigan, MD; Location: [facility]; Service: Ophthalmology; Laterality: Right;   Hand surgery Right   KNEE ARTHROSCOPY  Right   Knee arthroscopy with partial medial and lateral menisectomy with partial synovectomy Left 08/18/2017  Dr.Damontae Loppnow   knee surgeries  3x   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   Past Family History: Family History  Problem Relation Age of Onset   High blood pressure (Hypertension) Mother   Heart failure Mother   Vision loss Mother   Kidney disease Father   Cancer Father   Coronary Artery Disease (Blocked arteries around heart) Son   Other Other  "rotator cuff problems"   Glaucoma Neg Hx   Macular degeneration Neg Hx   Anesthesia problems Neg Hx   Medications: Current Outpatient Medications Ordered in Epic  Medication Sig Dispense Refill   LORazepam (ATIVAN) 0.5 MG tablet Take one tablet PO daily PRN for Anxiety 10 tablet 0   metoprolol succinate (TOPROL-XL) 50 MG XL tablet Take 0.5 tablets (25 mg total) by mouth once daily 45 tablet 3   sildenafil (REVATIO) 20 mg tablet Take 1-5 tab as needed 90 tablet 0   terazosin (HYTRIN) 10 MG capsule TAKE 1 CAPSULE BY MOUTH EVERY DAY 90 capsule 1   triamcinolone 0.1 % cream APPLY TO AFFECTED AREA TWICE A DAY AS NEEDED FOR FLARES   No current Epic-ordered facility-administered medications on file.   Allergies: Allergies  Allergen Reactions   Augmentin [Amoxicillin-Pot Clavulanate] Swelling   Oxycodone Hives   Penicillins Unknown   Tetracyclines Rash   Zithromax [Azithromycin] Unknown    There is no height or weight on file to calculate BMI.  Review of Systems: A comprehensive 14 point ROS was performed, reviewed, and the pertinent orthopaedic findings are documented in the HPI.  There were no vitals  filed for this visit.   General Physical Examination:   General:  Well developed, well nourished, no apparent distress, normal affect, antalgic gait with no assistive devices  HEENT: Head normocephalic, atraumatic, PERRL.   Abdomen: Soft, non tender, non distended, Bowel sounds present.  Heart: Examination of  the heart reveals regular, rate, and rhythm. There is no murmur noted on ascultation. There is a normal apical pulse.  Lungs: Lungs are clear to auscultation. There is no wheeze, rhonchi, or crackles. There is normal expansion of bilateral chest walls.   Left knee: Left lower extremity shows moderate effusion, tenderness on the medial joint line and no tenderness on the lateral joint line. No swelling or edema throughout the left leg. Knee stable to valgus and varus stress testing. Slight varus deformity. He is able to straight leg raise. Knee range of motion 15 to 85 degrees. He has good hip range of motion with no discomfort.  X-rays of the left knee show patient has underwent prior right total knee arthroplasty. Components appear well. Left knee has severe degenerative changes in the medial compartment with tricompartmental osteoarthritis with significant spurring and degenerative changes in the lateral patellofemoral compartments.  Assessment: ICD-10-CM  1. Primary osteoarthritis of left knee M17.12   Plan: 47. 75 year old male with advanced left knee osteoarthritis. Conservative treatment no longer providing him with relief and pain is interfering with quality of life and activities daily living. He has a hard time standing due to left knee pain, stiffness and swelling. Risks, benefits, complications of a left total knee arthroplasty have been discussed with the patient. Patient has agreed and consented the procedure with Dr. Hessie Knows on 11/13/2020.   Electronically signed by Feliberto Gottron, [place] at 11/03/2020 3:46 PM EDT  Reviewed  H+P. No changes noted. 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  I discussed with him we will do things during surgery tomorrow to minimize bleeding intraoperatively and postoperatively.

## 2020-11-13 NOTE — Plan of Care (Signed)

## 2020-11-13 NOTE — Evaluation (Addendum)
Physical Therapy Evaluation Patient Details Name: Nathan Boone MRN: [id number] DOB: 13-Apr-1945 Today's Date: 11/13/2020   History of Present Illness  admitted for acute hospitalization s/p L TKR, WBAT (11/13/20)  Clinical Impression  Patient resting in bed upon arrival to room; wife and son at bedside.  Patient alert and oriented; follows commands and agreeable to participation with session as tolerated.  Rates pain in L knee 6/10; meds received prior to session.  R LE strength grossly 4-/5, knee flexion limited to 40 degrees (previous orthopedic injury/surgery); L LE grossly 3-/5, ROM 12-50 degrees.  L LE generally guarded and pain-limited.  Currently requiring mod assist for bed mobility; close sup for unsupported sitting.  Attempted pre-sit/stand and boosting from edge of bed; unable to tolerate WBing through bilat LEs necessary for lift off from seating surface.  Will continue to assess/progress as appropriate in subsequent sessions. Would benefit from skilled PT to address above deficits and promote optimal return to PLOF; recommend transition to STR upon discharge from acute hospitalization.     Follow Up Recommendations SNF    Equipment Recommendations       Recommendations for Other Services       Precautions / Restrictions Precautions Precautions: Fall Restrictions Weight Bearing Restrictions: Yes LLE Weight Bearing: Weight bearing as tolerated      Mobility  Bed Mobility Overal bed mobility: Needs Assistance Bed Mobility: Supine to Sit;Sit to Supine     Supine to sit: Mod assist Sit to supine: Mod assist   General bed mobility comments: for L LE management, truncal elevation    Transfers                 General transfer comment: unable due to pain  Ambulation/Gait             General Gait Details: unable due to pain  Stairs            Wheelchair Mobility    Modified Rankin (Stroke Patients Only)       Balance Overall balance  assessment: Needs assistance Sitting-balance support: No upper extremity supported;Feet supported Sitting balance-Leahy Scale: Good                                       Pertinent Vitals/Pain Pain Assessment: 0-10 Pain Score: 6  Pain Location: L knee Pain Descriptors / Indicators: Aching;Guarding;Grimacing Pain Intervention(s): Limited activity within patient's tolerance;Monitored during session;Repositioned    Home Living Family/patient expects to be discharged to:: Private residence Living Arrangements: Spouse/significant other Available Help at Discharge: Family;Available PRN/intermittently Type of Home: House Home Access: Ramped entrance     Home Layout: One level        Prior Function Level of Independence: Independent         Comments: Indep with ADLs, household and community mobilization without assist device; 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     Hand Dominance        Extremity/Trunk Assessment   Upper Extremity Assessment Upper Extremity Assessment: Overall WFL for tasks assessed    Lower Extremity Assessment Lower Extremity Assessment: Generalized weakness (R LE grossly 4-/5, knee flex 40 degrees; L LE grossly 3-/5, 12-50 degrees ROM)       Communication   Communication: No difficulties  Cognition Arousal/Alertness: Awake/alert Behavior During Therapy: WFL for tasks assessed/performed Overall Cognitive Status: Within Functional Limits for tasks assessed  General Comments      Exercises Total Joint Exercises Goniometric ROM: L knee: 12-50 degrees, generally pain lmited Other Exercises Other Exercises: Supine L LE therex, 1x10, act assist ROM: ankle pumps, quad sets, SAQs, heel slides, hip abduct/adduct.  Limited isolated muscle control; very limited/guardd due to pain. Other Exercises: Educated in relaxation techniques (for pain conrol), positioning  and mobility progression/goals; patient voiced understanding.   Assessment/Plan    PT Assessment Patient needs continued PT services  PT Problem List Decreased strength;Decreased range of motion;Decreased activity tolerance;Decreased mobility;Decreased knowledge of use of DME;Decreased safety awareness;Decreased knowledge of precautions;Decreased skin integrity;Pain       PT Treatment Interventions DME instruction;Gait training;Stair training;Functional mobility training;Balance training;Therapeutic exercise;Therapeutic activities;Patient/family education    PT Goals (Current goals can be found in the Care Plan section)  Acute Rehab PT Goals Patient Stated Goal: to return home PT Goal Formulation: With patient/family Time For Goal Achievement: 11/27/20 Potential to Achieve Goals: Good    Frequency BID   Barriers to discharge        Co-evaluation               AM-PAC PT "6 Clicks" Mobility  Outcome Measure Help needed turning from your back to your side while in a flat bed without using bedrails?: A Little Help needed moving from lying on your back to sitting on the side of a flat bed without using bedrails?: A Lot Help needed moving to and from a bed to a chair (including a wheelchair)?: Total Help needed standing up from a chair using your arms (e.g., wheelchair or bedside chair)?: Total Help needed to walk in hospital room?: Total Help needed climbing 3-5 steps with a railing? : Total 6 Click Score: 9    End of Session   Activity Tolerance: Patient limited by pain Patient left: in bed;with call bell/phone within reach;with bed alarm set;with family/visitor present Nurse Communication: Mobility status PT Visit Diagnosis: Pain;Muscle weakness (generalized) (M62.81);Other abnormalities of gait and mobility (R26.89) Pain - Right/Left: Left Pain - part of body: Knee    Time: [id number] PT Time Calculation (min) (ACUTE ONLY): 28 min   Charges:   PT Evaluation $PT  Eval Moderate Complexity: 1 Mod PT Treatments $Therapeutic Exercise: 8-22 mins       Darcee Dekker H. Owens Shark, PT, DPT, NCS 11/13/20, 10:50 PM [phone]

## 2020-11-14 DIAGNOSIS — F419 Anxiety disorder, unspecified: Secondary | ICD-10-CM | POA: Diagnosis present

## 2020-11-14 DIAGNOSIS — Z79899 Other long term (current) drug therapy: Secondary | ICD-10-CM | POA: Diagnosis not present

## 2020-11-14 DIAGNOSIS — Z841 Family history of disorders of kidney and ureter: Secondary | ICD-10-CM | POA: Diagnosis not present

## 2020-11-14 DIAGNOSIS — Z809 Family history of malignant neoplasm, unspecified: Secondary | ICD-10-CM | POA: Diagnosis not present

## 2020-11-14 DIAGNOSIS — Z8619 Personal history of other infectious and parasitic diseases: Secondary | ICD-10-CM | POA: Diagnosis not present

## 2020-11-14 DIAGNOSIS — M1712 Unilateral primary osteoarthritis, left knee: Secondary | ICD-10-CM | POA: Diagnosis present

## 2020-11-14 DIAGNOSIS — Z8249 Family history of ischemic heart disease and other diseases of the circulatory system: Secondary | ICD-10-CM | POA: Diagnosis not present

## 2020-11-14 DIAGNOSIS — Z85828 Personal history of other malignant neoplasm of skin: Secondary | ICD-10-CM | POA: Diagnosis not present

## 2020-11-14 DIAGNOSIS — L4050 Arthropathic psoriasis, unspecified: Secondary | ICD-10-CM | POA: Diagnosis present

## 2020-11-14 DIAGNOSIS — I10 Essential (primary) hypertension: Secondary | ICD-10-CM | POA: Diagnosis present

## 2020-11-14 DIAGNOSIS — Z96651 Presence of right artificial knee joint: Secondary | ICD-10-CM | POA: Diagnosis present

## 2020-11-14 DIAGNOSIS — Z821 Family history of blindness and visual loss: Secondary | ICD-10-CM | POA: Diagnosis not present

## 2020-11-14 LAB — BASIC METABOLIC PANEL
Anion gap: 4 — ABNORMAL LOW (ref 5–15)
BUN: 11 mg/dL (ref 8–23)
CO2: 28 mmol/L (ref 22–32)
Calcium: 8.3 mg/dL — ABNORMAL LOW (ref 8.9–10.3)
Chloride: 104 mmol/L (ref 98–111)
Creatinine, Ser: 0.88 mg/dL (ref 0.61–1.24)
GFR, Estimated: >60 mL/min (ref >60)
Glucose, Bld: 148 mg/dL — ABNORMAL HIGH (ref 70–99)
Potassium: 4.2 mmol/L (ref 3.5–5.1)
Sodium: 136 mmol/L (ref 135–145)

## 2020-11-14 LAB — GLUCOSE, CAPILLARY: Glucose-Capillary: 133 mg/dL — ABNORMAL HIGH (ref 70–99)

## 2020-11-14 MED ORDER — ENOXAPARIN SODIUM 40 MG/0.4ML IJ SOSY: 40.0000 mg | PREFILLED_SYRINGE | INTRAMUSCULAR | 0 refills | Status: DC

## 2020-11-14 MED ORDER — TRAMADOL HCL 50 MG PO TABS: 50.0000 mg | ORAL_TABLET | Freq: Four times a day (QID) | ORAL | 0 refills | Status: DC | PRN

## 2020-11-14 MED ORDER — METHOCARBAMOL 500 MG PO TABS
500.0000 mg | ORAL_TABLET | Freq: Four times a day (QID) | ORAL | 0 refills | Status: DC | PRN
Start: 2020-11-14 — End: 2024-02-17

## 2020-11-14 MED ORDER — HYDROCODONE-ACETAMINOPHEN 5-325 MG PO TABS: 1.0000 | ORAL_TABLET | ORAL | 0 refills | Status: DC | PRN

## 2020-11-14 MED ORDER — DOCUSATE SODIUM 100 MG PO CAPS
100.0000 mg | ORAL_CAPSULE | Freq: Two times a day (BID) | ORAL | 0 refills | Status: DC
Start: 2020-11-14 — End: 2024-02-17

## 2020-11-14 MED ORDER — SILDENAFIL CITRATE 20 MG PO TABS: 20.0000 mg | ORAL_TABLET | Freq: Every day | ORAL | 0 refills | Status: DC | PRN

## 2020-11-14 NOTE — TOC Progression Note (Signed)
Transition of Care [facility]) - Progression Note    Patient Details  Name: Nathan Boone MRN: [id number] Date of Birth: 21-Dec-1945  Transition of Care [facility]) CM/SW [place], RN Phone Number: 11/14/2020, 2:46 PM  Clinical Narrative:   Patient states that MD informed him that he was going home with home health.  [place] aware.  However, PT recommended SNF.  Patient would like an additional PT evaluation prior to consenting to SNF.  He refused PT earlier due to pain, but PT will return to evaluate.         Expected Discharge Plan and Services                                                 Social Determinants of Health (SDOH) Interventions    Readmission Risk Interventions No flowsheet data found.

## 2020-11-14 NOTE — Evaluation (Addendum)
Occupational Therapy Evaluation Patient Details Name: Nathan Boone MRN: [id number] DOB: 1945/07/03 Today's Date: 11/14/2020    History of Present Illness admitted for acute hospitalization s/p L TKR, WBAT (11/13/20)   Clinical Impression   Nathan Boone was seen for OT evaluation this date. Prior to hospital admission, pt was Independent with ADLs and mobility. Pt lives with spouse who pt reports as disabled, unable to provide physical assist. Pt presents to acute OT demonstrating impaired ADL performance and functional mobility 2/2 decreased activity tolerance, functional strength/ROM/balance deficits, and poor insight into deficits. Pt currently requires MAX A for LB access seated EOB. MAX A for ADL t/f. SETUP seated grooming tasks, requires BUE support in standing, unable to progress to standing grooming. Pt instructed in polar care mgmt and d/c recommendations with pt stating he does not want rehab but agreeable that he could not d/c home at his current level of function. Pt would benefit from skilled OT to address noted impairments and functional limitations (see below for any additional details) in order to maximize safety and independence while minimizing falls risk and caregiver burden. Upon hospital discharge, recommend STR to maximize pt safety and return to PLOF.     Follow Up Recommendations  SNF    Equipment Recommendations  3 in 1 bedside commode    Recommendations for Other Services       Precautions / Restrictions Precautions Precautions: Fall;Knee Precaution Booklet Issued: No Restrictions Weight Bearing Restrictions: Yes RLE Weight Bearing: Weight bearing as tolerated LLE Weight Bearing: Weight bearing as tolerated      Mobility Bed Mobility Overal bed mobility: Needs Assistance Bed Mobility: Supine to Sit     Supine to sit: Mod assist     General bed mobility comments: assist for trunk and LLE    Transfers Overall transfer level: Needs  assistance Equipment used: Rolling walker (2 wheeled) Transfers: Sit to/from Stand Sit to Stand: Max assist;From elevated surface         General transfer comment: Pt required max assist to stand from elevated bed height. max vcs for technique and sequencing. pt's poor ROM in BLE(knees) make transfers extremely difficult.    Balance Overall balance assessment: Needs assistance Sitting-balance support: No upper extremity supported;Feet supported Sitting balance-Leahy Scale: Good Sitting balance - Comments: no LOB in sitting   Standing balance support: Bilateral upper extremity supported;During functional activity Standing balance-Leahy Scale: Fair Standing balance comment: reliant on UE support                           ADL either performed or assessed with clinical judgement   ADL Overall ADL's : Needs assistance/impaired                                       General ADL Comments: MAX A for LB access seated EOB. MAX A for ADL t/f. SETUP seated grooming tasks, requires BUE support in standing, unable to progress to standing grooming      Pertinent Vitals/Pain Pain Assessment: Faces Pain Score: 6  Faces Pain Scale: Hurts even more Pain Location: L knee Pain Descriptors / Indicators: Aching;Guarding;Grimacing Pain Intervention(s): Limited activity within patient's tolerance;Repositioned     Hand Dominance     Extremity/Trunk Assessment Upper Extremity Assessment Upper Extremity Assessment: Overall WFL for tasks assessed   Lower Extremity Assessment Lower Extremity Assessment: Generalized weakness  Communication Communication Communication: No difficulties   Cognition Arousal/Alertness: Awake/alert Behavior During Therapy: WFL for tasks assessed/performed Overall Cognitive Status: Within Functional Limits for tasks assessed                                 General Comments: requires MOD encouragement for OOB to  chair      Exercises Exercises: Other exercises Other Exercises Other Exercises: Pt educated re: OT role, DME recs, d/c recs, falls prevention, ECS Other Exercises: LBD, sup>sit, sit<>stand, sitting/standing balance/tolerance   Shoulder Instructions      Home Living Family/patient expects to be discharged to:: Private residence Living Arrangements: Spouse/significant other Available Help at Discharge: Family;Available PRN/intermittently Type of Home: House Home Access: Ramped entrance     Home Layout: One level                          Prior Functioning/Environment Level of Independence: Independent        Comments: Indep with ADLs, household and community mobilization without assist device; 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        OT Problem List: Decreased strength;Decreased range of motion;Decreased activity tolerance;Impaired balance (sitting and/or standing);Decreased safety awareness      OT Treatment/Interventions: Self-care/ADL training;Therapeutic exercise;DME and/or AE instruction;Energy conservation;Therapeutic activities;Patient/family education;Balance training    OT Goals(Current goals can be found in the care plan section) Acute Rehab OT Goals Patient Stated Goal: to return home OT Goal Formulation: With patient Time For Goal Achievement: 11/28/20 Potential to Achieve Goals: Good ADL Goals Pt Will Perform Grooming: with min assist;standing (c LRAD PRN) Pt Will Perform Lower Body Dressing: with min assist;with caregiver independent in assisting;sit to/from stand (c LRAD PRN) Pt Will Transfer to Toilet: with min guard assist;ambulating;bedside commode (c LRAD PRN)  OT Frequency: Min 2X/week   Barriers to D/C: Decreased caregiver support             AM-PAC OT "6 Clicks" Daily Activity     Outcome Measure Help from another person eating meals?: None Help from another person taking care of personal grooming?: A  Little Help from another person toileting, which includes using toliet, bedpan, or urinal?: A Lot Help from another person bathing (including washing, rinsing, drying)?: A Lot Help from another person to put on and taking off regular upper body clothing?: None Help from another person to put on and taking off regular lower body clothing?: A Lot 6 Click Score: 17   End of Session Equipment Utilized During Treatment: Gait belt;Rolling walker  Activity Tolerance: Patient tolerated treatment well Patient left: in chair;with call bell/phone within reach;with chair alarm set  OT Visit Diagnosis: Other abnormalities of gait and mobility (R26.89)                Time: [id number] OT Time Calculation (min): 16 min Charges:  OT General Charges $OT Visit: 1 Visit OT Evaluation $OT Eval Low Complexity: 1 Low OT Treatments $Self Care/Home Management : 8-22 mins  Dessie Coma, M.S. OTR/L  11/14/20, 11:36 AM  ascom [phone]

## 2020-11-14 NOTE — Discharge Summary (Signed)
Physician Discharge Summary  Patient ID: Nathan Boone MRN: [id number] DOB/AGE: 74-08-1945 74 y.o.  Admit date: 11/13/2020 Discharge date:11/17/2020  Admission Diagnoses:  S/P TKR (total knee replacement) using cement, left [Z96.652]   Discharge Diagnoses: Patient Active Problem List   Diagnosis Date Noted   S/P TKR (total knee replacement) using cement, 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    Past Medical History:  Diagnosis Date   Anemia    Anxiety    Basal cell carcinoma    left cheek and left forearm   History of shingles    Hypertension    Osteoarthritis    Psoriasis    Rhinitis      Transfusion: none   Consultants (if any):   Discharged Condition: Improved  Hospital Course: Nathan Boone is an 75 y.o. male who was admitted 11/13/2020 with a diagnosis of left knee osteoarthritis and went to the operating room on 11/13/2020 and underwent the above named procedures.    Surgeries: Procedure(s): TOTAL KNEE ARTHROPLASTY on 11/13/2020 Patient tolerated the surgery well. Taken to PACU where she was stabilized and then transferred to the orthopedic floor.  Started on Lovenox 30 mg q 12 hrs. Foot pumps applied bilaterally at 80 mm. Heels elevated on bed with rolled towels. No evidence of DVT. Negative Homan. Physical therapy started on day #1 for gait training and transfer. OT started day #1 for ADL and assisted devices.  Patient's foley was d/c on day #1. Patient's IV  was d/c on day #2. Patient made progress with PT on POD 2,3,4. Pain controlled VSS.  On post op day #4 patient was stable and ready for discharge to home with HHPT.    He was given perioperative antibiotics:  Anti-infectives (From admission, onward)    Start     Dose/Rate Route Frequency Ordered Stop   11/13/20 1400  ceFAZolin (ANCEF) IVPB 2g/100 mL premix        2 g 200 mL/hr over 30 Minutes Intravenous Every 6 hours 11/13/20 1130 11/13/20 2104   11/13/20 0630  ceFAZolin (ANCEF) 2-4 GM/100ML-% IVPB       Note to  Pharmacy: Maryagnes Amos   : cabinet override      11/13/20 0630 11/13/20 1726   11/13/20 0600  ceFAZolin (ANCEF) IVPB 2g/100 mL premix        2 g 200 mL/hr over 30 Minutes Intravenous On call to O.R. 11/13/20 [id number] 11/13/20 0750     .  He was given sequential compression devices, early ambulation, and Lovenox, teds for DVT prophylaxis.  He benefited maximally from the hospital stay and there were no complications.    Recent vital signs:  Vitals:   11/17/20 0525 11/17/20 0759  BP: (!) 167/85 (!) 169/79  Pulse: 76 76  Resp: 17 18  Temp: 98.3 F (36.8 C) 99.6 F (37.6 C)  SpO2: 100% 95%    Recent laboratory studies:  Lab Results  Component Value Date   HGB 10.9 (L) 11/13/2020   HGB 12.7 (L) 11/03/2020   HGB 13.2 08/11/2017   Lab Results  Component Value Date   WBC 5.6 11/13/2020   PLT 160 11/13/2020   No results found for: INR Lab Results  Component Value Date   NA 136 11/14/2020   K 4.2 11/14/2020   CL 104 11/14/2020   CO2 28 11/14/2020   BUN 11 11/14/2020   CREATININE 0.88 11/14/2020   GLUCOSE 148 (H) 11/14/2020    Discharge Medications:   Allergies as of 11/17/2020  Reactions   Augmentin [amoxicillin-pot Clavulanate]    Unknown reaction   Macrolides And Ketolides    Unknown reaction   Tetracyclines & Related Rash   Sunburn rash        Medication List     STOP taking these medications    ibuprofen 200 MG tablet Commonly known as: ADVIL       TAKE these medications    docusate sodium 100 MG capsule Commonly known as: COLACE Take 1 capsule (100 mg total) by mouth 2 (two) times daily.   enoxaparin 40 MG/0.4ML injection Commonly known as: LOVENOX Inject 0.4 mLs (40 mg total) into the skin daily for 14 days.   HYDROcodone-acetaminophen 5-325 MG tablet Commonly known as: NORCO/VICODIN Take 1-2 tablets by mouth every 4 (four) hours as needed for moderate pain (pain score 4-6).   Melatonin 10 MG Tabs Take 10 mg by mouth at  bedtime.   methocarbamol 500 MG tablet Commonly known as: ROBAXIN Take 1 tablet (500 mg total) by mouth every 6 (six) hours as needed for muscle spasms.   metoprolol succinate 50 MG 24 hr tablet Commonly known as: TOPROL-XL Take 25 mg by mouth daily.   mometasone 50 MCG/ACT nasal spray Commonly known as: NASONEX Place 2 sprays into the nose daily as needed.   sildenafil 20 MG tablet Commonly known as: REVATIO Take 1 tablet (20 mg total) by mouth daily as needed (ED). What changed: how much to take   terazosin 10 MG capsule Commonly known as: HYTRIN Take 10 mg by mouth daily.   traMADol 50 MG tablet Commonly known as: ULTRAM Take 1 tablet (50 mg total) by mouth every 6 (six) hours as needed.   triamcinolone cream 0.1 % Commonly known as: KENALOG Apply 1 application topically daily as needed (psoriasis).               Durable Medical Equipment  (From admission, onward)           Start     Ordered   11/13/20 1125  DME Walker rolling  Once       Question Answer Comment  Walker: With 5 Inch Wheels   Patient needs a walker to treat with the following condition S/P TKR (total knee replacement) using cement, left      11/13/20 1130   11/13/20 1125  DME 3 n 1  Once        11/13/20 1130   11/13/20 1125  DME Bedside commode  Once       Question:  Patient needs a bedside commode to treat with the following condition  Answer:  S/P TKR (total knee replacement) using cement, left   11/13/20 1130            Diagnostic Studies: DG Knee 1-2 Views Left  Result Date: 11/13/2020 CLINICAL DATA:  Status post left knee arthroplasty. EXAM: LEFT KNEE - 1-2 VIEW COMPARISON:  CT of the left knee on 09/25/2020 FINDINGS: Status post total arthroplasty of the left knee. Femoral and tibial components demonstrate normal alignment. Lateral view shows normal positioning of the patella. Expected air and fluid in the joint space. IMPRESSION: Normal alignment and radiographic appearance  following left total knee arthroplasty. Electronically Signed   By: Aletta Edouard M.D.   On: 11/13/2020 10:45    Disposition:      Follow-up Information     Duanne Guess, PA-C Follow up in 2 week(s).   Specialties: Orthopedic Surgery, Emergency Medicine Contact information: [address]  [place] [address] [phone]                  Signed: Dorise Hiss [facility] 11/17/2020, 8:06 AM

## 2020-11-14 NOTE — Progress Notes (Signed)
Physical Therapy Treatment Patient Details Name: Nathan Boone MRN: [id number] DOB: [birth year] Today's Date: 11/14/2020    History of Present Illness admitted for acute hospitalization s/p L TKR, WBAT (11/13/20)    PT Comments     Pt was seated EOB upon arriving. Author educated pt on importance of proper positioning to promote improved ROM. He states understanding and was agreeable to attempt OOB activity. He requires max assist to stand from elevated bed height. Pt's poor ROM in bilateral knees make transfers difficult. Lengthy discussion about concern with pt to want to DC straight to home from acute hospital. PT continues to highly recommend DC to SNF to address deficits prior to pt returning to home environment. Pt eventually states he will consider rehab at DC. Once in standing during session, pt was able to ambulate with antalgic step to gait pattern ~ 60 ft. Min assist required during turns to prevent LOB. Author will return this afternoon with session focus on improving BLE ROM and strength.    Follow Up Recommendations  SNF     Equipment Recommendations  Rolling walker with 5" wheels;3in1 (PT)       Precautions / Restrictions Precautions Precautions: Fall;Knee Precaution Booklet Issued: No Restrictions Weight Bearing Restrictions: Yes RLE Weight Bearing: Weight bearing as tolerated LLE Weight Bearing: Weight bearing as tolerated    Mobility  Bed Mobility        General bed mobility comments: Seated EOB pre/post session. post session, pt requested to eat breakfast (tray arrived during session) at [facility] RN aware and bed alarm in place.    Transfers Overall transfer level: Needs assistance Equipment used: Rolling walker (2 wheeled) Transfers: Sit to/from Stand Sit to Stand: Max assist;From elevated surface         General transfer comment: Pt required max assist to stand from elevated bed height. max vcs for technique and sequencing. pt's poor ROM in BLE(knees)  make transfers extremely difficult.  Ambulation/Gait Ambulation/Gait assistance: Min guard;Min assist Gait Distance (Feet): 60 Feet Assistive device: Rolling walker (2 wheeled) Gait Pattern/deviations: Step-to pattern;Antalgic;Trunk flexed;Decreased weight shift to right;Decreased step length - right;Decreased stance time - left;Decreased stride length Gait velocity: decreased   General Gait Details: Pt was able to ambulate 60 ft with slow abntalgic step to pattern. No LOB however did require min assist when turning for safety.     Balance Overall balance assessment: Needs assistance Sitting-balance support: No upper extremity supported;Feet supported Sitting balance-Leahy Scale: Good Sitting balance - Comments: no LOB in sitting   Standing balance support: Bilateral upper extremity supported;During functional activity Standing balance-Leahy Scale: Fair Standing balance comment: reliant on UE support      Cognition Arousal/Alertness: Awake/alert Behavior During Therapy: WFL for tasks assessed/performed Overall Cognitive Status: Within Functional Limits for tasks assessed        General Comments: Pt is A and O x 4. Agrees to session with encouragament. was sitting EOB independently upon arrival. Pryor Curia educated pt on importance on keep extension and positioning.         General Comments General comments (skin integrity, edema, etc.): Lengthy discussion with pt on DC disposition. Highly recommend DC to SNF. pt wanting to go home but is now considering SNF to improve independence prior. will address ROM and ther ex in next PM session      Pertinent Vitals/Pain Pain Assessment: 0-10 Pain Score: 6  Pain Location: L knee Pain Descriptors / Indicators: Aching;Guarding;Grimacing Pain Intervention(s): Limited activity within patient's tolerance;Monitored during session;Premedicated before session;Repositioned;Ice  applied     PT Goals (current goals can now be found in the care  plan section) Acute Rehab PT Goals Patient Stated Goal: to return home Progress towards PT goals: Progressing toward goals    Frequency    BID      PT Plan Current plan remains appropriate       AM-PAC PT "6 Clicks" Mobility   Outcome Measure  Help needed turning from your back to your side while in a flat bed without using bedrails?: A Little Help needed moving from lying on your back to sitting on the side of a flat bed without using bedrails?: A Lot Help needed moving to and from a bed to a chair (including a wheelchair)?: A Lot Help needed standing up from a chair using your arms (e.g., wheelchair or bedside chair)?: A Lot Help needed to walk in hospital room?: A Little Help needed climbing 3-5 steps with a railing? : A Lot 6 Click Score: 14    End of Session Equipment Utilized During Treatment: Gait belt Activity Tolerance: Patient limited by pain;Patient tolerated treatment well Patient left: Other (comment) (pt was sitting EOB with RN aware while eating his breakfast. was educated on importance of knee extension and positioning for proper healing on LE) Nurse Communication: Mobility status PT Visit Diagnosis: Pain;Muscle weakness (generalized) (M62.81);Other abnormalities of gait and mobility (R26.89) Pain - Right/Left: Left Pain - part of body: Knee     Time: [id number] PT Time Calculation (min) (ACUTE ONLY): 24 min  Charges:  $Gait Training: 8-22 mins $Therapeutic Activity: 8-22 mins                     Julaine Fusi PTA 11/14/20, 10:07 AM

## 2020-11-14 NOTE — Discharge Instructions (Signed)

## 2020-11-14 NOTE — Progress Notes (Signed)
PT Cancellation Note  Patient Details Name: Nathan Boone MRN: [id number] DOB: 02/25/1946   Cancelled Treatment:     PT attempt. Pt refusing at this time. Author will return later this date in hopes pt will be willing to participate. Lengthy education on importance of improving ROM and strength, and participating in PT to improve safety with ADLs. Pt's spouse and son at bedside also encouraging pt however remained unwilling. Author continues to recommend SNF at DC.    Willette Pa 11/14/2020, 1:29 PM

## 2020-11-14 NOTE — Progress Notes (Signed)
   Subjective: 1 Day Post-Op Procedure(s) (LRB): TOTAL KNEE ARTHROPLASTY (Left) Patient reports pain as moderate.   Patient is well, and has had no acute complaints or problems Denies any CP, SOB, ABD pain. We will continue therapy today.  Plan is to go Home after hospital stay.  Objective: Vital signs in last 24 hours: Temp:  [97.6 F (36.4 C)-98.5 F (36.9 C)] 98.5 F (36.9 C) (08/12 0730) Pulse Rate:  [41-69] 62 (08/12 0730) Resp:  [13-21] 17 (08/12 0730) BP: (101-157)/(60-80) 155/72 (08/12 0730) SpO2:  [97 %-100 %] 99 % (08/12 0730) Weight:  [89.7 kg] 89.7 kg (08/11 1130)  Intake/Output from previous day: 08/11 0701 - 08/12 0700 In: 1460 [P.O.:360; I.V.:800; IV Piggyback:300] Out: 1050 [Urine:850; Blood:200] Intake/Output this shift: No intake/output data recorded.  Recent Labs    11/13/20 1147  HGB 10.9*   Recent Labs    11/13/20 1147  WBC 5.6  RBC 3.29*  HCT 31.3*  PLT 160   Recent Labs    11/13/20 1147 11/14/20 0438  NA  --  136  K  --  4.2  CL  --  104  CO2  --  28  BUN  --  11  CREATININE 0.96 0.88  GLUCOSE  --  148*  CALCIUM  --  8.3*   No results for input(s): LABPT, INR in the last 72 hours.  EXAM General - Patient is Alert, Appropriate, and Oriented Extremity - Neurovascular intact Sensation intact distally Intact pulses distally Dorsiflexion/Plantar flexion intact No cellulitis present Compartment soft Dressing - dressing C/D/I and no drainage Praveena intact without drainage Motor Function - intact, moving foot and toes well on exam.   Past Medical History:  Diagnosis Date   Anemia    Anxiety    Basal cell carcinoma    left cheek and left forearm   History of shingles    Hypertension    Osteoarthritis    Psoriasis    Rhinitis     Assessment/Plan:   1 Day Post-Op Procedure(s) (LRB): TOTAL KNEE ARTHROPLASTY (Left) Active Problems:   S/P TKR (total knee replacement) using cement, left  Estimated body mass index is  30.07 kg/m as calculated from the following:   Height as of this encounter: '5\' 8"'$  (1.727 m).   Weight as of this encounter: 89.7 kg. Advance diet Up with therapy Work on bowel movement Pain currently moderate.  Continue with current pain regimen. Vital signs are stable Care management to assist with discharge.  Patient prefers to go home with home health PT.  Will discharge home pending progress with PT.  DVT Prophylaxis - Lovenox, TED hose, and SCDs Weight-Bearing as tolerated to left leg   T. Rachelle Hora, PA-C [place] 11/14/2020, 8:10 AM

## 2020-11-14 NOTE — Progress Notes (Signed)
Physical Therapy Treatment Patient Details Name: Nathan Boone MRN: [id number] DOB: Jan 23, 1946 Today's Date: 11/14/2020    History of Present Illness admitted for acute hospitalization s/p L TKR, WBAT (11/13/20)    PT Comments    Pt was long sitting in bed upon arriving. He agrees to session and is cooperative however continues to be limited by pain. Endorse 7/10 pain and was issued pain medication during session. Pt severely limited ROM on BLEs make transfers difficulty. Max assist to stand from elevated bed height with vcs throughout for improved technique. PT was able to tolerate slightly more ROM/stretching however only able to achieve 55 degrees flexion after several minutes stretching. Continued encouragement required to go to rehab. Pt will need a lot of PT going forward to improve safety with functional mobility, transfers, and gait. Will continue to see pt throughout hospitalization with focus on improving ROM, strength, and transfer abilities.    Follow Up Recommendations  SNF     Equipment Recommendations  Rolling walker with 5" wheels;3in1 (PT)       Precautions / Restrictions Precautions Precautions: Fall;Knee Precaution Booklet Issued: No Restrictions Weight Bearing Restrictions: Yes RLE Weight Bearing: Weight bearing as tolerated LLE Weight Bearing: Weight bearing as tolerated    Mobility  Bed Mobility Overal bed mobility: Needs Assistance Bed Mobility: Supine to Sit;Sit to Supine     Supine to sit: Mod assist Sit to supine: Mod assist        Transfers Overall transfer level: Needs assistance Equipment used: Rolling walker (2 wheeled) Transfers: Sit to/from Stand Sit to Stand: Max assist;From elevated surface    Balance Overall balance assessment: Needs assistance Sitting-balance support: No upper extremity supported;Feet supported Sitting balance-Leahy Scale: Good Sitting balance - Comments: no LOB in sitting   Standing balance support: Bilateral  upper extremity supported;During functional activity Standing balance-Leahy Scale: Fair Standing balance comment: reliant on UE support    Cognition Arousal/Alertness: Awake/alert Behavior During Therapy: WFL for tasks assessed/performed Overall Cognitive Status: Within Functional Limits for tasks assessed        Exercises Total Joint Exercises Ankle Circles/Pumps: AROM;10 reps Quad Sets: AROM;10 reps Heel Slides: AAROM;Seated;5 reps Goniometric ROM: AAROM 5-55 degrees        Pertinent Vitals/Pain Pain Assessment: 0-10 Pain Score: 7  Faces Pain Scale: Hurts even more Pain Location: L knee Pain Descriptors / Indicators: Aching;Guarding;Grimacing Pain Intervention(s): Limited activity within patient's tolerance;Monitored during session;Premedicated before session;Repositioned;RN gave pain meds during session     PT Goals (current goals can now be found in the care plan section) Acute Rehab PT Goals Patient Stated Goal: to return home Progress towards PT goals: Progressing toward goals    Frequency    BID      PT Plan Current plan remains appropriate       AM-PAC PT "6 Clicks" Mobility   Outcome Measure  Help needed turning from your back to your side while in a flat bed without using bedrails?: A Little Help needed moving from lying on your back to sitting on the side of a flat bed without using bedrails?: A Lot Help needed moving to and from a bed to a chair (including a wheelchair)?: A Lot Help needed standing up from a chair using your arms (e.g., wheelchair or bedside chair)?: A Lot Help needed to walk in hospital room?: A Little Help needed climbing 3-5 steps with a railing? : A Lot 6 Click Score: 14    End of Session Equipment Utilized During Treatment:  Gait belt Activity Tolerance: Patient limited by pain Patient left: in bed;with call bell/phone within reach;with bed alarm set;with nursing/sitter in room;with family/visitor present;with SCD's  reapplied Nurse Communication: Mobility status PT Visit Diagnosis: Pain;Muscle weakness (generalized) (M62.81);Other abnormalities of gait and mobility (R26.89) Pain - Right/Left: Left Pain - part of body: Knee     Time: 1550-1615 PT Time Calculation (min) (ACUTE ONLY): 25 min  Charges:  $Therapeutic Exercise: 8-22 mins $Therapeutic Activity: 8-22 mins                     Julaine Fusi PTA 11/14/20, 4:47 PM

## 2020-11-15 NOTE — Progress Notes (Signed)
Physical Therapy Treatment Patient Details Name: Nathan Boone MRN: [id number] DOB: [birth year] Today's Date: 11/15/2020    History of Present Illness admitted for acute hospitalization s/p L TKR, WBAT (11/13/20)    PT Comments    Pt was A and O x 4. Agrees to session and reports less overall pain today. He was long sitting in bed and continues to require mod assist to exit bed. Pt is unable to tolerate standard surface heights do to limited ROM in BLEs. Lengthy discussion about concerns with wanting to DC home and amount of PT he will have at home versus at rehab. Pt continues to be unwilling to consider rehab option. He has poor ROM in both LEs which impacts overall safety with ADLs. He requires mod assist to stand from ELEVATED bed height. Unwilling to sit in recliner due to lower surface heights. Once in standing, pt does well with ambulation. Does have a ramp entry at home. " I think I'll be better by Monday to go home." Pt's family are in agreement SNF is best option however pt does not feel this way. Acute PT will continue to progress ROM/strength/safety with ADLs. If pt goes directly home will need RW.     Follow Up Recommendations  SNF;Other (comment) (pt does not want to go to SNF even with requiring extensive assistance to perform bed mobility and transfers. If DC home will need extensive HHPT and 24/7 assistance. Pt has poor ROM in both non operative LE and operative LE.)     Equipment Recommendations  Rolling walker with 5" wheels       Precautions / Restrictions Precautions Precautions: Fall;Knee Precaution Booklet Issued: Yes (comment) Restrictions Weight Bearing Restrictions: Yes RLE Weight Bearing: Weight bearing as tolerated LLE Weight Bearing: Weight bearing as tolerated    Mobility  Bed Mobility Overal bed mobility: Needs Assistance Bed Mobility: Supine to Sit;Sit to Supine     Supine to sit: Mod assist Sit to supine: Mod assist   General bed mobility  comments: Pt continues to require extensive assistance to exit and re-enter bed. unwilling to sit in recliner 2/2 to inabilityt o flex knees to lower surface height. Author continues to inforce that pt will need to improve ROM tolerance for improved safety with ADLs. Pt will require assistance at home if he continues to refuse rehab    Transfers Overall transfer level: Needs assistance Equipment used: Rolling walker (2 wheeled) Transfers: Sit to/from Stand Sit to Stand: Mod assist;From elevated surface;Max assist         General transfer comment: pt required mod assist from greatly elevated bed height. amx assist from even a slightly elevated surface. pt's poor ROM/tolerance to flexion impacts safety with transfers greatly.  Ambulation/Gait Ambulation/Gait assistance: Min guard;Supervision Gait Distance (Feet): 200 Feet Assistive device: Rolling walker (2 wheeled) Gait Pattern/deviations: Step-to pattern;Antalgic;Trunk flexed;Decreased weight shift to right;Decreased step length - right;Decreased stance time - left;Decreased stride length Gait velocity: decreased   General Gait Details: Pt was able to easily ambulate one lap in hallway with 3 standing rest. Poor overall gait kinematics due to pain.   Stairs Stairs:  (pt has ramp entry at home)       Balance Overall balance assessment: Needs assistance Sitting-balance support: No upper extremity supported;Feet supported Sitting balance-Leahy Scale: Good Sitting balance - Comments: no LOB in sitting   Standing balance support: Bilateral upper extremity supported;During functional activity Standing balance-Leahy Scale: Fair Standing balance comment: reliant on UE support for all dynamic task  Cognition Arousal/Alertness: Awake/alert Behavior During Therapy: WFL for tasks assessed/performed Overall Cognitive Status: Within Functional Limits for tasks assessed          General Comments: Pt is A and O x 4. lack  insight of normal ROM and lacks insight of his deficits. family wanting him to go to rehab however pt does not want to.             Pertinent Vitals/Pain Pain Assessment: 0-10 Pain Score: 6  Faces Pain Scale: Hurts a little bit Pain Location: L knee Pain Descriptors / Indicators: Guarding Pain Intervention(s): Limited activity within patient's tolerance;Monitored during session;Premedicated before session;Repositioned;Ice applied     PT Goals (current goals can now be found in the care plan section) Acute Rehab PT Goals Patient Stated Goal: to return home Progress towards PT goals: Progressing toward goals    Frequency    BID      PT Plan Current plan remains appropriate       AM-PAC PT "6 Clicks" Mobility   Outcome Measure  Help needed turning from your back to your side while in a flat bed without using bedrails?: A Little Help needed moving from lying on your back to sitting on the side of a flat bed without using bedrails?: A Lot Help needed moving to and from a bed to a chair (including a wheelchair)?: A Lot Help needed standing up from a chair using your arms (e.g., wheelchair or bedside chair)?: A Lot Help needed to walk in hospital room?: A Little Help needed climbing 3-5 steps with a railing? : A Lot 6 Click Score: 14    End of Session Equipment Utilized During Treatment: Gait belt Activity Tolerance: Patient limited by pain Patient left: in bed;with call bell/phone within reach;with bed alarm set;with nursing/sitter in room;with family/visitor present;with SCD's reapplied (pt unwilling to sit i recliner due to pain with knee flexion) Nurse Communication: Mobility status PT Visit Diagnosis: Pain;Muscle weakness (generalized) (M62.81);Other abnormalities of gait and mobility (R26.89) Pain - Right/Left: Left Pain - part of body: Knee     Time: 0825-0850 PT Time Calculation (min) (ACUTE ONLY): 25 min  Charges:  $Gait Training: 8-22 mins $Therapeutic  Activity: 8-22 mins                     Julaine Fusi PTA 11/15/20, 9:07 AM

## 2020-11-15 NOTE — Progress Notes (Signed)
Physical Therapy Treatment Patient Details Name: Nathan Boone MRN: [id number] DOB: [birth year] Today's Date: 11/15/2020    History of Present Illness admitted for acute hospitalization s/p L TKR, WBAT (11/13/20)    PT Comments    Pt was long sitting in bed upon arriving. He agrees to PT session requesting to go to BR. Did have small successful BM during session. After ambulating to BR, session focused on improving ROM. Used adjusting bed height and AAROM exercises to progress knee flexion. Pt has AROM 3-70 degrees. Reapplied polar care and pt was repositioned in bed with towel roll placed under heel. Pt continues to be most limited by pain/ROM deficits. PT recommend DC to SNF however pt still unwilling.    Follow Up Recommendations  SNF     Equipment Recommendations  Rolling walker with 5" wheels       Precautions / Restrictions Precautions Precautions: Fall;Knee Precaution Booklet Issued: Yes (comment) Restrictions Weight Bearing Restrictions: Yes RLE Weight Bearing: Weight bearing as tolerated LLE Weight Bearing: Weight bearing as tolerated    Mobility  Bed Mobility Overal bed mobility: Needs Assistance Bed Mobility: Supine to Sit;Sit to Supine     Supine to sit: Mod assist Sit to supine: Mod assist   General bed mobility comments: Pt continues to require extensive assistance to exit and re-enter bed after OOB activity    Transfers Overall transfer level: Needs assistance Equipment used: Rolling walker (2 wheeled) Transfers: Sit to/from Stand Sit to Stand: Mod assist;From elevated surface     Ambulation/Gait Ambulation/Gait assistance: Supervision Gait Distance (Feet): 20 Feet Assistive device: Rolling walker (2 wheeled) Gait Pattern/deviations: Step-to pattern;Antalgic;Trunk flexed;Decreased weight shift to right;Decreased step length - right;Decreased stance time - left;Decreased stride length Gait velocity: decreased   General Gait Details: pt ambulated  form EOB to BR      Balance Overall balance assessment: Needs assistance Sitting-balance support: No upper extremity supported;Feet supported Sitting balance-Leahy Scale: Good Sitting balance - Comments: no LOB in sitting   Standing balance support: Bilateral upper extremity supported;During functional activity Standing balance-Leahy Scale: Fair Standing balance comment: reliant on UE support for all dynamic task                            Cognition Arousal/Alertness: Awake/alert Behavior During Therapy: WFL for tasks assessed/performed Overall Cognitive Status: Within Functional Limits for tasks assessed                                 General Comments: Pt was A and O x 4      Exercises Total Joint Exercises Ankle Circles/Pumps: AROM;10 reps Quad Sets: AROM;10 reps Heel Slides: AAROM;Seated;5 reps Goniometric ROM: 3-70 degrees    General Comments        Pertinent Vitals/Pain Pain Assessment: 0-10 Pain Score: 6  Faces Pain Scale: Hurts a little bit Pain Location: L knee Pain Descriptors / Indicators: Guarding Pain Intervention(s): Limited activity within patient's tolerance;Monitored during session;Premedicated before session;Repositioned;Ice applied    Home Living                      Prior Function            PT Goals (current goals can now be found in the care plan section) Acute Rehab PT Goals Patient Stated Goal: to return home Progress towards PT goals: Progressing toward goals  Frequency    BID      PT Plan Current plan remains appropriate    Co-evaluation              AM-PAC PT "6 Clicks" Mobility   Outcome Measure  Help needed turning from your back to your side while in a flat bed without using bedrails?: A Little Help needed moving from lying on your back to sitting on the side of a flat bed without using bedrails?: A Lot Help needed moving to and from a bed to a chair (including a  wheelchair)?: A Lot Help needed standing up from a chair using your arms (e.g., wheelchair or bedside chair)?: A Lot Help needed to walk in hospital room?: A Little Help needed climbing 3-5 steps with a railing? : A Lot 6 Click Score: 14    End of Session Equipment Utilized During Treatment: Gait belt Activity Tolerance: Patient limited by pain Patient left: in bed;with call bell/phone within reach;with bed alarm set;with nursing/sitter in room;with family/visitor present;with SCD's reapplied Nurse Communication: Mobility status PT Visit Diagnosis: Pain;Muscle weakness (generalized) (M62.81);Other abnormalities of gait and mobility (R26.89) Pain - Right/Left: Left Pain - part of body: Knee     Time: 1420-1445 PT Time Calculation (min) (ACUTE ONLY): 25 min  Charges:  $Therapeutic Exercise: 8-22 mins $Therapeutic Activity: 8-22 mins                     Julaine Fusi PTA 11/15/20, 3:01 PM

## 2020-11-15 NOTE — Progress Notes (Signed)
Subjective: 2 Days Post-Op Procedure(s) (LRB): TOTAL KNEE ARTHROPLASTY (Left) Patient reports pain as moderate.   Patient is well, and has had no acute complaints or problems Denies any CP, SOB, ABD pain. We will continue therapy today.  Plan is to go Home after hospital stay. Patient is passing gas without pain.  Objective: Vital signs in last 24 hours: Temp:  [97.9 F (36.6 C)-98.7 F (37.1 C)] 98.7 F (37.1 C) (08/13 0752) Pulse Rate:  [61-71] 65 (08/13 0752) Resp:  [16-19] 16 (08/13 0752) BP: (141-161)/(67-88) 142/68 (08/13 0752) SpO2:  [96 %-98 %] 96 % (08/13 0752)  Intake/Output from previous day: 08/12 0701 - 08/13 0700 In: 240 [P.O.:240] Out: 1200 [Urine:1200] Intake/Output this shift: No intake/output data recorded.  Recent Labs    11/13/20 1147  HGB 10.9*   Recent Labs    11/13/20 1147  WBC 5.6  RBC 3.29*  HCT 31.3*  PLT 160   Recent Labs    11/13/20 1147 11/14/20 0438  NA  --  136  K  --  4.2  CL  --  104  CO2  --  28  BUN  --  11  CREATININE 0.96 0.88  GLUCOSE  --  148*  CALCIUM  --  8.3*   No results for input(s): LABPT, INR in the last 72 hours.  EXAM General - Patient is Alert, Appropriate, and Oriented Extremity - Neurovascular intact Sensation intact distally Intact pulses distally Dorsiflexion/Plantar flexion intact No cellulitis present Compartment soft Dressing - dressing C/D/I and no drainage Praveena intact without drainage There is a small blister along the lateral aspect of the knee under the adhesive, no purulent material and no drainage is seen. Motor Function - intact, moving foot and toes well on exam.  Negative homans to bilateral lower extremities. Abdomen soft with normal bowel sounds.  Past Medical History:  Diagnosis Date   Anemia    Anxiety    Basal cell carcinoma    left cheek and left forearm   History of shingles    Hypertension    Osteoarthritis    Psoriasis    Rhinitis     Assessment/Plan:   2  Days Post-Op Procedure(s) (LRB): TOTAL KNEE ARTHROPLASTY (Left) Active Problems:   S/P TKR (total knee replacement) using cement, left  Estimated body mass index is 30.07 kg/m as calculated from the following:   Height as of this encounter: '5\' 8"'$  (1.727 m).   Weight as of this encounter: 89.7 kg. Advance diet Up with therapy  Vitals stable on review. Small blister noted on the lateral aspect of the knee, will continue to monitor.  If adhesive is making the blister worse tomorrow will remove woundvac and place bandage dressing. Patient is passing gas, work on BM. Continue with therapy today, patient would like to return home.  Possible d/c home tomorrow pending continued progress with PT.  DVT Prophylaxis - Lovenox, TED hose, and SCDs Weight-Bearing as tolerated to left leg  J. Cameron Proud, PA-C [place] 11/15/2020, 8:18 AM

## 2020-11-16 MED ORDER — TRAMADOL HCL 50 MG PO TABS
50.0000 mg | ORAL_TABLET | Freq: Four times a day (QID) | ORAL | Status: DC
  Administered 2020-11-16 – 2020-11-17 (×4): 100 mg via ORAL
  Filled 2020-11-16 (×4): qty 2

## 2020-11-16 NOTE — Progress Notes (Signed)
Subjective: 3 Days Post-Op Procedure(s) (LRB): TOTAL KNEE ARTHROPLASTY (Left) Patient reports pain as moderate.   Patient is well, and has had no acute complaints or problems Denies any CP, SOB, ABD pain. We will continue therapy today.  Plan is to go Home after hospital stay. Patient is passing gas without pain. PT currently recommending SNF but patient is really wanting to go home.  Objective: Vital signs in last 24 hours: Temp:  [97.9 F (36.6 C)-99.3 F (37.4 C)] 98.2 F (36.8 C) (08/14 0812) Pulse Rate:  [67-75] 71 (08/14 0812) Resp:  [16-18] 18 (08/14 0812) BP: (134-161)/(71-77) 144/77 (08/14 0812) SpO2:  [97 %-100 %] 97 % (08/14 0812)  Intake/Output from previous day: 08/13 0701 - 08/14 0700 In: 1239.6 [P.O.:480; I.V.:759.6] Out: -  Intake/Output this shift: No intake/output data recorded.  Recent Labs    11/13/20 1147  HGB 10.9*   Recent Labs    11/13/20 1147  WBC 5.6  RBC 3.29*  HCT 31.3*  PLT 160   Recent Labs    11/13/20 1147 11/14/20 0438  NA  --  136  K  --  4.2  CL  --  104  CO2  --  28  BUN  --  11  CREATININE 0.96 0.88  GLUCOSE  --  148*  CALCIUM  --  8.3*   No results for input(s): LABPT, INR in the last 72 hours.  EXAM General - Patient is Alert, Appropriate, and Oriented Extremity - Neurovascular intact Sensation intact distally Intact pulses distally Dorsiflexion/Plantar flexion intact No cellulitis present Compartment soft Dressing - dressing C/D/I and no drainage Praveena intact without drainage There is a blister along the lateral aspect of the knee under the adhesive, no purulent material and no drainage is seen.  No significant change in size today. Motor Function - intact, moving foot and toes well on exam.  Negative homans to bilateral lower extremities. Abdomen soft with normal bowel sounds.  Past Medical History:  Diagnosis Date   Anemia    Anxiety    Basal cell carcinoma    left cheek and left forearm   History  of shingles    Hypertension    Osteoarthritis    Psoriasis    Rhinitis     Assessment/Plan:   3 Days Post-Op Procedure(s) (LRB): TOTAL KNEE ARTHROPLASTY (Left) Active Problems:   S/P TKR (total knee replacement) using cement, left  Estimated body mass index is 30.07 kg/m as calculated from the following:   Height as of this encounter: '5\' 8"'$  (1.727 m).   Weight as of this encounter: 89.7 kg. Advance diet Up with therapy  Vitals stable on review. Small blister noted on the lateral aspect of the knee, not worsening at this time.  Prevena was left intact for now. Patient is passing gas, he has had a small BM. PT currently recommending SNF, will work with PT today.  Depending on how he does today will discuss bed search for SNF tomorrow vs. Discharge home with HHPT.  DVT Prophylaxis - Lovenox, TED hose, and SCDs Weight-Bearing as tolerated to left leg  J. Cameron Proud, PA-C [place] 11/16/2020, 8:16 AM

## 2020-11-16 NOTE — Progress Notes (Signed)
Physical Therapy Treatment Patient Details Name: Nathan Boone MRN: [id number] DOB: [birth year] Today's Date: 11/16/2020    History of Present Illness admitted for acute hospitalization s/p L TKR, WBAT (11/13/20)    PT Comments    Participated in exercises as described below.  Pt struggles but is able to get up/down from bed today on his own with rails.  He is able to complete lap around unit with RW and min guard.  Overall progressing well with gait with less assist today.  BLE ROM remains limited which affects his ability to get up/down from regular height chairs - stated bed and chair at home are elevated.   While mobility is improving, SNF is still recommended as L knee ROM remains impaired and given limitations of R knee it is important to maximize therapy for improved outcome of L to allow for improved transfers.  Stressed importance of ROM and self ROM during the day during mobility with staff.   Follow Up Recommendations  SNF     Equipment Recommendations  Rolling walker with 5" wheels    Recommendations for Other Services       Precautions / Restrictions Precautions Precautions: Fall;Knee Restrictions Weight Bearing Restrictions: Yes RLE Weight Bearing: Weight bearing as tolerated LLE Weight Bearing: Weight bearing as tolerated    Mobility  Bed Mobility Overal bed mobility: Needs Assistance Bed Mobility: Supine to Sit;Sit to Supine     Supine to sit: Min guard Sit to supine: Min guard   General bed mobility comments: able to do on his own today with time and encouragement to try    Transfers Overall transfer level: Needs assistance Equipment used: Rolling walker (2 wheeled) Transfers: Sit to/from Stand Sit to Stand: Min assist;From elevated surface            Ambulation/Gait Ambulation/Gait assistance: Supervision;Min guard Gait Distance (Feet): 220 Feet Assistive device: Rolling walker (2 wheeled) Gait Pattern/deviations: Step-to  pattern;Antalgic;Trunk flexed;Decreased weight shift to right;Decreased step length - right;Decreased stance time - left;Decreased stride length Gait velocity: decreased       Stairs             Wheelchair Mobility    Modified Rankin (Stroke Patients Only)       Balance Overall balance assessment: Needs assistance Sitting-balance support: No upper extremity supported;Feet supported Sitting balance-Leahy Scale: Good Sitting balance - Comments: no LOB in sitting   Standing balance support: Bilateral upper extremity supported;During functional activity Standing balance-Leahy Scale: Fair Standing balance comment: reliant on UE support for all dynamic task                            Cognition Arousal/Alertness: Awake/alert Behavior During Therapy: WFL for tasks assessed/performed Overall Cognitive Status: Within Functional Limits for tasks assessed                                        Exercises Total Joint Exercises Ankle Circles/Pumps: AROM;10 reps Quad Sets: AROM;10 reps Heel Slides: AAROM;Seated;5 reps    General Comments        Pertinent Vitals/Pain Pain Assessment: Faces Faces Pain Scale: Hurts little more Pain Location: L knee Pain Descriptors / Indicators: Guarding Pain Intervention(s): Limited activity within patient's tolerance;Monitored during session;Premedicated before session;Repositioned    Home Living  Prior Function            PT Goals (current goals can now be found in the care plan section) Progress towards PT goals: Progressing toward goals    Frequency    BID      PT Plan Current plan remains appropriate    Co-evaluation              AM-PAC PT "6 Clicks" Mobility   Outcome Measure  Help needed turning from your back to your side while in a flat bed without using bedrails?: A Little Help needed moving from lying on your back to sitting on the side of a flat  bed without using bedrails?: A Little Help needed moving to and from a bed to a chair (including a wheelchair)?: A Little Help needed standing up from a chair using your arms (e.g., wheelchair or bedside chair)?: A Little Help needed to walk in hospital room?: A Little Help needed climbing 3-5 steps with a railing? : A Lot 6 Click Score: 17    End of Session Equipment Utilized During Treatment: Gait belt Activity Tolerance: Patient limited by pain Patient left: in bed;with call bell/phone within reach;with bed alarm set;with nursing/sitter in room;with family/visitor present;with SCD's reapplied Nurse Communication: Mobility status PT Visit Diagnosis: Pain;Muscle weakness (generalized) (M62.81);Other abnormalities of gait and mobility (R26.89) Pain - Right/Left: Left Pain - part of body: Knee     Time: [id number] PT Time Calculation (min) (ACUTE ONLY): 24 min  Charges:  $Gait Training: 8-22 mins $Therapeutic Exercise: 8-22 mins                    Chesley Noon, PTA 11/16/20, 12:17 PM , 12:13 PM

## 2020-11-17 NOTE — Progress Notes (Signed)
Physical Therapy Treatment Patient Details Name: Nathan Boone MRN: [id number] DOB: 1946/03/25 Today's Date: 11/17/2020    History of Present Illness admitted for acute hospitalization s/p L TKR, WBAT (11/13/20)    PT Comments    Carlyle Lipa to get to EOB with time and supervision today.  Stood and is able to complete lap x 1.  Participated in exercises as described below.  Pt does ask to try to sit in recliner.  Had refused prior due difficulty getting up but he was able to stand x 2 from recliner with min guard.  Family in at end of session and discussed home safety and importance of HEP to increase ROM.  While SNF would still be beneficial for pt given limited ROM in bilateral knees, he has demonstrated improved mobility and has chosen to return home with family support.    ROM remains limited in L knee post op and R knee from a prior TKA.  He has good arm strength that benefits him for transfers but will need to focus on increasing R knee ROM for a successful outcome.  He voices understanding.     Follow Up Recommendations  SNF - Pt declining SNF.  HHPT is warrented     Equipment Recommendations  Rolling walker with 5" wheels    Recommendations for Other Services       Precautions / Restrictions Precautions Precautions: Fall;Knee Precaution Booklet Issued: Yes (comment) Restrictions Weight Bearing Restrictions: Yes RLE Weight Bearing: Weight bearing as tolerated LLE Weight Bearing: Weight bearing as tolerated    Mobility  Bed Mobility Overal bed mobility: Needs Assistance Bed Mobility: Supine to Sit     Supine to sit: Supervision     General bed mobility comments: able to do on his own today with time and encouragement to try    Transfers Overall transfer level: Needs assistance Equipment used: Rolling walker (2 wheeled) Transfers: Sit to/from Stand Sit to Stand: Min guard         General transfer comment: from bed and  recliner  Ambulation/Gait Ambulation/Gait assistance: Supervision;Min guard Gait Distance (Feet): 200 Feet Assistive device: Rolling walker (2 wheeled) Gait Pattern/deviations: Step-to pattern;Antalgic;Trunk flexed;Decreased weight shift to right;Decreased step length - right;Decreased stance time - left;Decreased stride length Gait velocity: decreased       Stairs             Wheelchair Mobility    Modified Rankin (Stroke Patients Only)       Balance Overall balance assessment: Needs assistance Sitting-balance support: No upper extremity supported;Feet supported Sitting balance-Leahy Scale: Good     Standing balance support: Bilateral upper extremity supported;During functional activity Standing balance-Leahy Scale: Fair Standing balance comment: reliant on UE support for all dynamic task                            Cognition Arousal/Alertness: Awake/alert Behavior During Therapy: WFL for tasks assessed/performed Overall Cognitive Status: Within Functional Limits for tasks assessed                                        Exercises Total Joint Exercises Ankle Circles/Pumps: AROM;10 reps Quad Sets: AROM;10 reps Heel Slides: AAROM;10 reps;Supine Hip ABduction/ADduction: AAROM;Supine;10 reps Straight Leg Raises: AAROM;Supine;10 reps Long Arc Quad: AAROM;Seated;10 reps Knee Flexion: AAROM;Seated;5 reps Goniometric ROM: 3-72 - good effort today to increase ROM  General Comments        Pertinent Vitals/Pain Faces Pain Scale: Hurts a little bit Pain Location: L knee Pain Descriptors / Indicators: Sore Pain Intervention(s): Limited activity within patient's tolerance;Monitored during session;Premedicated before session;Repositioned;Ice applied    Home Living                      Prior Function            PT Goals (current goals can now be found in the care plan section) Progress towards PT goals: Progressing toward  goals    Frequency    BID      PT Plan Current plan remains appropriate    Co-evaluation              AM-PAC PT "6 Clicks" Mobility   Outcome Measure  Help needed turning from your back to your side while in a flat bed without using bedrails?: None Help needed moving from lying on your back to sitting on the side of a flat bed without using bedrails?: None Help needed moving to and from a bed to a chair (including a wheelchair)?: A Little Help needed standing up from a chair using your arms (e.g., wheelchair or bedside chair)?: A Little Help needed to walk in hospital room?: A Little Help needed climbing 3-5 steps with a railing? : A Lot 6 Click Score: 19    End of Session Equipment Utilized During Treatment: Gait belt Activity Tolerance: Patient limited by pain Patient left: in bed;with call bell/phone within reach;with bed alarm set;with nursing/sitter in room;with family/visitor present;with SCD's reapplied Nurse Communication: Mobility status PT Visit Diagnosis: Pain;Muscle weakness (generalized) (M62.81);Other abnormalities of gait and mobility (R26.89) Pain - Right/Left: Left Pain - part of body: Knee     Time: 0927-0955 PT Time Calculation (min) (ACUTE ONLY): 28 min  Charges:  $Gait Training: 8-22 mins $Therapeutic Exercise: 8-22 mins                    Chesley Noon, PTA 11/17/20, 11:09 AM , 11:05 AM

## 2020-11-17 NOTE — TOC Transition Note (Signed)
Transition of Care [facility]) - CM/SW Discharge Note   Patient Details  Name: Nathan Boone MRN: [id number] Date of Birth: 1945-12-31  [facility] CM/SW Contact:  Candie Chroman, LCSW Phone Number: 11/17/2020, 8:44 AM   Clinical Narrative: Patient has orders to discharge home today. This CSW working remote today. Called patient in the room. Patient confirmed plan to go home today. He is agreeable to [facility] for home health services. Representative is aware of discharge today. Patient agreeable to walker. Already has a bedside commode at home. Ordered walker through [employer] to be delivered to the room before he leaves. No further concerns. CSW signing off.    Final next level of care: [place] Barriers to Discharge: Barriers Resolved   Patient Goals and CMS Choice     Choice offered to / list presented to : Patient  Discharge Placement                Patient to be transferred to facility by: Son will pick him up.   Patient and family notified of of transfer: 11/17/20  Discharge Plan and Services                DME Arranged: Gilford Rile rolling DME Agency: [employer] Date DME Agency Contacted: 11/17/20   Representative spoke with at DME Agency: Suanne Marker HH Arranged: PT [place]: [place] Date [place]: 11/17/20   Representative spoke with at [place]: [place]  Social Determinants of Health ([place]) Interventions     Readmission Risk Interventions No flowsheet data found.

## 2020-11-17 NOTE — Progress Notes (Signed)
Patient discharged home via personal vehicle. No IV. All belongings sent with patient. [facility] switched over. [facility] care sent with patient. Discharge went over with patient and family with opportunity to ask questions. Prescriptions given to family. NT to wheel patient to car for discharge. Walker delivered, [facility] set up.

## 2020-11-17 NOTE — Progress Notes (Signed)
   Subjective: 4 Days Post-Op Procedure(s) (LRB): TOTAL KNEE ARTHROPLASTY (Left) Patient reports pain as mild.   Patient is well, and has had no acute complaints or problems Denies any CP, SOB, ABD pain. We will continue therapy today.  Plan is to go Home after hospital stay.  Objective: Vital signs in last 24 hours: Temp:  [98.2 F (36.8 C)-99.6 F (37.6 C)] 99.6 F (37.6 C) (08/15 0759) Pulse Rate:  [61-76] 76 (08/15 0759) Resp:  [14-20] 18 (08/15 0759) BP: (138-169)/(70-88) 169/79 (08/15 0759) SpO2:  [95 %-100 %] 95 % (08/15 0759)  Intake/Output from previous day: 08/14 0701 - 08/15 0700 In: 960 [P.O.:960] Out: -  Intake/Output this shift: No intake/output data recorded.  No results for input(s): HGB in the last 72 hours.  No results for input(s): WBC, RBC, HCT, PLT in the last 72 hours.  No results for input(s): NA, K, CL, CO2, BUN, CREATININE, GLUCOSE, CALCIUM in the last 72 hours.  No results for input(s): LABPT, INR in the last 72 hours.  EXAM General - Patient is Alert, Appropriate, and Oriented Extremity - Neurovascular intact Sensation intact distally Intact pulses distally Dorsiflexion/Plantar flexion intact No cellulitis present Compartment soft Dressing - dressing C/D/I and no drainage Praveena intact without drainage Motor Function - intact, moving foot and toes well on exam.   Past Medical History:  Diagnosis Date   Anemia    Anxiety    Basal cell carcinoma    left cheek and left forearm   History of shingles    Hypertension    Osteoarthritis    Psoriasis    Rhinitis     Assessment/Plan:   4 Days Post-Op Procedure(s) (LRB): TOTAL KNEE ARTHROPLASTY (Left) Active Problems:   S/P TKR (total knee replacement) using cement, left  Estimated body mass index is 30.07 kg/m as calculated from the following:   Height as of this encounter: '5\' 8"'$  (1.727 m).   Weight as of this encounter: 89.7 kg. Advance diet Up with therapy Pain  controlled Vital signs are stable Care management to assist with discharge to home with HHPT  DVT Prophylaxis - Lovenox, TED hose, and SCDs Weight-Bearing as tolerated to left leg   T. Rachelle Hora, PA-C [place] 11/17/2020, 8:03 AM

## 2020-11-17 NOTE — Care Management Important Message (Signed)
Important Message  Patient Details  Name: Nathan Boone MRN: [id number] Date of Birth: May 04, 1945   Medicare Important Message Given:  Yes     Juliann Pulse A Jonni Oelkers 11/17/2020, 12:25 PM

## 2020-12-12 ENCOUNTER — Other Ambulatory Visit: Payer: Self-pay | Admitting: Orthopedic Surgery

## 2020-12-12 ENCOUNTER — Encounter
Admission: RE | Admit: 2020-12-12 | Discharge: 2020-12-12 | Disposition: A | Discharge status: Home / Self Care | Payer: Medicare Other | Source: Ambulatory Visit | Attending: Orthopedic Surgery | Admitting: Orthopedic Surgery

## 2020-12-12 ENCOUNTER — Other Ambulatory Visit: Payer: Self-pay

## 2020-12-12 HISTORY — DX: Zoster without complications: B02.9

## 2020-12-12 NOTE — Patient Instructions (Addendum)
Your procedure is scheduled on: Monday, September 12 Report to the Registration Desk on the 1st floor of the [facility]. To find out your arrival time, please call [phone] between 1PM - 3PM on: Friday, September 9  REMEMBER: Instructions that are not followed completely may result in serious medical risk, up to and including death; or upon the discretion of your surgeon and anesthesiologist your surgery may need to be rescheduled.  Do not eat food after midnight the night before surgery.  No gum chewing, lozengers or hard candies.  You may however, drink CLEAR liquids up to 2 hours before you are scheduled to arrive for your surgery. Do not drink anything within 2 hours of your scheduled arrival time.  Clear liquids include: - water  - apple juice without pulp - gatorade (not RED, PURPLE, OR BLUE) - black coffee or tea (Do NOT add milk or creamers to the coffee or tea) Do NOT drink anything that is not on this list.  TAKE THESE MEDICATIONS THE MORNING OF SURGERY WITH A SIP OF WATER:  Metoprolol Terazosin (Hytrin) Hydrocodone if needed for pain  One week prior to surgery: Stop Anti-inflammatories (NSAIDS) such as Advil, Aleve, Ibuprofen, Motrin, Naproxen, Naprosyn and Aspirin based products such as Excedrin, Goodys Powder, BC Powder. Stop ANY OVER THE COUNTER supplements until after surgery. (Melatonin, tums, vitamin D) You may however, continue to take Tylenol if needed for pain up until the day of surgery.  No Alcohol for 24 hours before or after surgery.  No Smoking including e-cigarettes for 24 hours prior to surgery.  No chewable tobacco products for at least 6 hours prior to surgery.  No nicotine patches on the day of surgery.  Do not use any "recreational" drugs for at least a week prior to your surgery.  Please be advised that the combination of cocaine and anesthesia may have negative outcomes, up to and including death. If you test positive for cocaine, your  surgery will be cancelled.  On the morning of surgery brush your teeth with toothpaste and water, you may rinse your mouth with mouthwash if you wish. Do not swallow any toothpaste or mouthwash.  Do not wear jewelry, make-up, hairpins, clips or nail polish.  Do not wear lotions, powders, or perfumes.   Do not shave body from the neck down 48 hours prior to surgery just in case you cut yourself which could leave a site for infection.  Also, freshly shaved skin may become irritated if using the CHG soap.  Contact lenses, hearing aids and dentures may not be worn into surgery.  Do not bring valuables to the hospital. [facility] is not responsible for any missing/lost belongings or valuables.   Use CHG Soap as directed on instruction sheet.  Notify your doctor if there is any change in your medical condition (cold, fever, infection).  Wear comfortable clothing (specific to your surgery type) to the hospital.  After surgery, you can help prevent lung complications by doing breathing exercises.  Take deep breaths and cough every 1-2 hours. Your doctor may order a device called an Incentive Spirometer to help you take deep breaths.  If you are being discharged the day of surgery, you will not be allowed to drive home. You will need a responsible adult (18 years or older) to drive you home and stay with you that night.   If you are taking public transportation, you will need to have a responsible adult (18 years or older) with you. Please  confirm with your physician that it is acceptable to use public transportation.   Please call the [place] Dept. at [phone] if you have any questions about these instructions.  Surgery Visitation Policy:  Patients undergoing a surgery or procedure may have one family member or support person with them as long as that person is not COVID-19 positive or experiencing its symptoms.  That person may remain in the waiting area during the  procedure.

## 2020-12-14 ENCOUNTER — Other Ambulatory Visit: Payer: Self-pay

## 2020-12-14 ENCOUNTER — Emergency Department
Admission: EM | Admit: 2020-12-14 | Discharge: 2020-12-14 | Disposition: A | Discharge status: Home / Self Care | Payer: Medicare Other | Attending: Emergency Medicine | Admitting: Emergency Medicine

## 2020-12-14 DIAGNOSIS — R319 Hematuria, unspecified: Secondary | ICD-10-CM | POA: Diagnosis present

## 2020-12-14 DIAGNOSIS — N390 Urinary tract infection, site not specified: Secondary | ICD-10-CM | POA: Diagnosis not present

## 2020-12-14 DIAGNOSIS — Z87891 Personal history of nicotine dependence: Secondary | ICD-10-CM | POA: Insufficient documentation

## 2020-12-14 DIAGNOSIS — Z85828 Personal history of other malignant neoplasm of skin: Secondary | ICD-10-CM | POA: Diagnosis not present

## 2020-12-14 DIAGNOSIS — Z96652 Presence of left artificial knee joint: Secondary | ICD-10-CM | POA: Insufficient documentation

## 2020-12-14 DIAGNOSIS — Z79899 Other long term (current) drug therapy: Secondary | ICD-10-CM | POA: Insufficient documentation

## 2020-12-14 DIAGNOSIS — I10 Essential (primary) hypertension: Secondary | ICD-10-CM | POA: Insufficient documentation

## 2020-12-14 LAB — URINALYSIS, COMPLETE (UACMP) WITH MICROSCOPIC
Glucose, UA: NEGATIVE mg/dL
Ketones, ur: 15 mg/dL — AB
Nitrite: POSITIVE — AB
Protein, ur: >300 mg/dL — AB
RBC / HPF: >50 RBC/hpf — ABNORMAL HIGH (ref 0–5)
Specific Gravity, Urine: 1.025 (ref 1.005–1.030)
Squamous Epithelial / HPF: NONE SEEN (ref 0–5)
WBC, UA: >50 WBC/hpf — ABNORMAL HIGH (ref 0–5)
pH: 5.5 (ref 5.0–8.0)

## 2020-12-14 MED ORDER — CEFDINIR 300 MG PO CAPS: 300.0000 mg | ORAL_CAPSULE | Freq: Two times a day (BID) | ORAL | 0 refills | Status: AC

## 2020-12-14 MED ORDER — CEFDINIR 300 MG PO CAPS
300.0000 mg | ORAL_CAPSULE | Freq: Two times a day (BID) | ORAL | Status: DC
  Administered 2020-12-14: 300 mg via ORAL
  Filled 2020-12-14: qty 1

## 2020-12-14 NOTE — ED Notes (Signed)
Pt presents to ED with c/o of having 1 episode of a clot of blood in his urine. Pt denies every having this happen. Pt also states he has burning on urination. Pt denies fevers or chills. Pt also states increased urine frequency. Pt is in NAD. Pt denies any other complaints at this time.

## 2020-12-14 NOTE — ED Provider Notes (Signed)
[facility]  ____________________________________________   Event Date/Time   First MD Initiated Contact with Patient 12/14/20 1727     (approximate)  I have reviewed the triage vital signs and the nursing notes.   HISTORY  Chief Complaint Hematuria    HPI Nathan Boone is a 75 y.o. male with past medical history of anemia, hypertension who presents with hematuria.  Patient has noticed some urgency and more difficulty urinating over the past several days.  Then today he urinated and there was blood.  He denies any abdominal pain or flank pain.  Denies nausea or vomiting, fevers or chills.  Denies prior history of BPH.  He is not on any blood thinners.  He had a total knee replacement 4 weeks ago but stopped taking Lovenox 2 weeks ago.         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    Past Surgical History:  Procedure Laterality Date   basal cell carcinoma removal     left cheek and left arm, left elbow and right arm   CARDIAC CATHETERIZATION     CARPAL TUNNEL RELEASE Right    CATARACT EXTRACTION W/ INTRAOCULAR LENS IMPLANT Left 06/11/2020   CATARACT EXTRACTION W/ INTRAOCULAR LENS IMPLANT Right 06/25/2020   COLONOSCOPY     JOINT REPLACEMENT Right 01/17/2014   knee   KNEE ARTHROSCOPY Right    KNEE ARTHROSCOPY WITH MEDIAL MENISECTOMY Left 08/18/2017   Procedure: KNEE ARTHROSCOPY WITH PARTIAL MEDIAL AND LATERAL MENISECTOMY WITH PARTIAL SYNOVECTOMY;  Surgeon: Hessie Knows, MD;  Location: [facility] ORS;  Service: Orthopedics;  Laterality: Left;   RETINAL LASER PROCEDURE Right    TOTAL KNEE ARTHROPLASTY Left 11/13/2020   Procedure: TOTAL KNEE ARTHROPLASTY;   Surgeon: Hessie Knows, MD;  Location: [facility] ORS;  Service: Orthopedics;  Laterality: Left;    Prior to Admission medications   Medication Sig Start Date End Date Taking? Authorizing Provider  cefdinir (OMNICEF) 300 MG capsule Take 1 capsule (300 mg total) by mouth 2 (two) times daily for 10 days. 12/14/20 12/24/20 Yes Rada Hay, MD  calcium carbonate (TUMS - DOSED IN MG ELEMENTAL CALCIUM) 500 MG chewable tablet Chew 1 tablet by mouth daily as needed for indigestion or heartburn.    [provider]  docusate sodium (COLACE) 100 MG capsule Take 1 capsule (100 mg total) by mouth 2 (two) times daily. Patient taking differently: Take 100 mg by mouth 2 (two) times daily as needed for moderate constipation. 11/14/20   Duanne Guess, PA-C  HYDROcodone-acetaminophen (NORCO/VICODIN) 5-325 MG tablet Take 1-2 tablets by mouth every 4 (four) hours as needed for moderate pain (pain score 4-6). Patient taking differently: Take 2 tablets by mouth 3 (three) times daily as needed for moderate pain (pain score 4-6). 11/14/20   Duanne Guess, PA-C  Melatonin 10 MG TABS Take 10 mg by mouth at bedtime as needed (sleep).    [provider]  methocarbamol (ROBAXIN) 500 MG tablet Take 1 tablet (500 mg total) by mouth every 6 (six) hours as needed for muscle spasms. 11/14/20   Arvella Nigh,  Thomas C, PA-C  metoprolol succinate (TOPROL-XL) 50 MG 24 hr tablet Take 25 mg by mouth daily. 12/09/16   [provider]  mometasone (NASONEX) 50 MCG/ACT nasal spray Place 2 sprays into the nose daily as needed.    [provider]  Naphazoline-Glycerin (REDNESS RELIEF OP) Place 1 drop into both eyes daily as needed (redness).    [provider]  sildenafil (REVATIO) 20 MG tablet Take 1 tablet (20 mg total) by mouth daily as needed (ED). Patient taking differently: Take 60-80 mg by mouth daily as needed (ED). 11/14/20   Duanne Guess, PA-C  terazosin (HYTRIN) 10 MG capsule Take 10 mg by  mouth daily. 06/21/17   [provider]  triamcinolone cream (KENALOG) 0.1 % Apply 1 application topically daily as needed (psoriasis). 01/10/19   [provider]  VITAMIN D PO Take 1 capsule by mouth 2 (two) times a week.    [provider]    Allergies Augmentin [amoxicillin-pot clavulanate], Macrolides and ketolides, and Tetracyclines & related  No family history on file.  Social History Social History   Tobacco Use   Smoking status: Former    Types: Cigarettes    Quit date: 1970    Years since quitting: 52.7   Smokeless tobacco: Never  Vaping Use   Vaping Use: Never used  Substance Use Topics   Alcohol use: Yes    Alcohol/week: 11.0 standard drinks    Types: 7 Glasses of wine, 4 Cans of beer per week   Drug use: Never    Review of Systems   Review of Systems  Constitutional:  Negative for chills and fever.  Gastrointestinal:  Negative for abdominal pain, nausea and vomiting.  Genitourinary:  Positive for difficulty urinating, dysuria, frequency and hematuria.  All other systems reviewed and are negative.  Physical Exam Updated Vital Signs BP (!) 156/70   Pulse 61   Temp 98.8 F (37.1 C) (Oral)   Resp 18   Ht '5\' 8"'$  (1.727 m)   Wt 87.5 kg   SpO2 99%   BMI 29.35 kg/m   Physical Exam Vitals and nursing note reviewed.  Constitutional:      General: He is not in acute distress.    Appearance: Normal appearance.  HENT:     Head: Normocephalic and atraumatic.  Eyes:     General: No scleral icterus.    Conjunctiva/sclera: Conjunctivae normal.  Pulmonary:     Effort: Pulmonary effort is normal. No respiratory distress.     Breath sounds: Normal breath sounds. No wheezing.  Abdominal:     General: Abdomen is flat. There is no distension.     Palpations: Abdomen is soft.     Tenderness: There is no abdominal tenderness. There is no right CVA tenderness or left CVA tenderness.  Musculoskeletal:        General: No deformity or signs of  injury.     Cervical back: Normal range of motion.  Skin:    Coloration: Skin is not jaundiced or pale.  Neurological:     General: No focal deficit present.     Mental Status: He is alert and oriented to person, place, and time. Mental status is at baseline.  Psychiatric:        Mood and Affect: Mood normal.        Behavior: Behavior normal.     LABS (all labs ordered are listed, but only abnormal results are displayed)  Labs Reviewed  URINALYSIS, COMPLETE (UACMP) WITH MICROSCOPIC -  Abnormal; Notable for the following components:      Result Value   APPearance CLOUDY (*)    Hgb urine dipstick LARGE (*)    Bilirubin Urine SMALL (*)    Ketones, ur 15 (*)    Protein, ur >300 (*)    Nitrite POSITIVE (*)    Leukocytes,Ua MODERATE (*)    RBC / HPF >50 (*)    WBC, UA >50 (*)    Bacteria, UA MANY (*)    All other components within normal limits  URINE CULTURE   ____________________________________________  EKG  N/a ____________________________________________  RADIOLOGY Almeta Monas, personally viewed and evaluated these images (plain radiographs) as part of my medical decision making, as well as reviewing the written report by the radiologist.  ED MD interpretation:  n/a    ____________________________________________   PROCEDURES  Procedure(s) performed (including Critical Care):  Procedures   ____________________________________________   INITIAL IMPRESSION / ASSESSMENT AND PLAN / ED COURSE     75 year old male presents with urinary urgency and hematuria.  He is normal vital signs.  He is extremely well-appearing he has no CVA tenderness no abdominal tenderness and no systemic symptoms.  We will check a UA to rule out UTI.  Low suspicion for stone given he has no pain.  He is not anticoagulated.  UA is grossly positive.  Will treat for UTI.  Given he is a male that is complicated UTI.  Will treat with 10 days of cefdinir.  Return precautions  discussed.  Clinical Course as of 12/14/20 2106  Nancy Fetter Dec 14, 2020  1825 Nitrite(!): POSITIVE [KM]  1825 Chalmers Guest): MODERATE [KM]  1825 RBC / HPF(!): >50 [KM]  1825 WBC, UA(!): >50 [KM]    Clinical Course User Index [KM] Rada Hay, MD     ____________________________________________   FINAL CLINICAL IMPRESSION(S) / ED DIAGNOSES  Final diagnoses:  Urinary tract infection with hematuria, site unspecified     ED Discharge Orders          Ordered    cefdinir (OMNICEF) 300 MG capsule  2 times daily        12/14/20 1835             Note:  This document was prepared using Dragon voice recognition software and may include unintentional dictation errors.    Rada Hay, MD 12/14/20 2106

## 2020-12-14 NOTE — Discharge Instructions (Addendum)
Please take the antibiotic twice daily for the next 10 days.  Return to the emergency department if you develop fevers, chills or unable to eat or drink.

## 2020-12-14 NOTE — ED Triage Notes (Signed)
Pt to ED for hematuria and burning with urination that started today. Hx of prostate issues Recent surgery for knee

## 2020-12-15 ENCOUNTER — Encounter: Payer: Self-pay | Admitting: Orthopedic Surgery

## 2020-12-15 ENCOUNTER — Ambulatory Visit
Admission: RE | Admit: 2020-12-15 | Discharge: 2020-12-15 | Disposition: A | Discharge status: Home / Self Care | Payer: Medicare Other | Attending: Orthopedic Surgery | Admitting: Orthopedic Surgery

## 2020-12-15 ENCOUNTER — Ambulatory Visit: Payer: Medicare Other | Admitting: Anesthesiology

## 2020-12-15 ENCOUNTER — Encounter
Admission: RE | Disposition: A | Discharge status: Home / Self Care | Payer: Self-pay | Source: Home / Self Care | Attending: Orthopedic Surgery

## 2020-12-15 ENCOUNTER — Other Ambulatory Visit: Payer: Self-pay

## 2020-12-15 DIAGNOSIS — L4050 Arthropathic psoriasis, unspecified: Secondary | ICD-10-CM | POA: Insufficient documentation

## 2020-12-15 DIAGNOSIS — Z881 Allergy status to other antibiotic agents status: Secondary | ICD-10-CM | POA: Insufficient documentation

## 2020-12-15 DIAGNOSIS — M25462 Effusion, left knee: Secondary | ICD-10-CM | POA: Diagnosis not present

## 2020-12-15 DIAGNOSIS — M24662 Ankylosis, left knee: Secondary | ICD-10-CM | POA: Diagnosis not present

## 2020-12-15 DIAGNOSIS — M1712 Unilateral primary osteoarthritis, left knee: Secondary | ICD-10-CM | POA: Diagnosis not present

## 2020-12-15 DIAGNOSIS — Z880 Allergy status to penicillin: Secondary | ICD-10-CM | POA: Insufficient documentation

## 2020-12-15 DIAGNOSIS — Z885 Allergy status to narcotic agent status: Secondary | ICD-10-CM | POA: Diagnosis not present

## 2020-12-15 DIAGNOSIS — Z79899 Other long term (current) drug therapy: Secondary | ICD-10-CM | POA: Insufficient documentation

## 2020-12-15 DIAGNOSIS — I10 Essential (primary) hypertension: Secondary | ICD-10-CM | POA: Diagnosis not present

## 2020-12-15 DIAGNOSIS — Z96653 Presence of artificial knee joint, bilateral: Secondary | ICD-10-CM | POA: Diagnosis not present

## 2020-12-15 DIAGNOSIS — Z8249 Family history of ischemic heart disease and other diseases of the circulatory system: Secondary | ICD-10-CM | POA: Diagnosis not present

## 2020-12-15 HISTORY — PX: KNEE CLOSED REDUCTION: SHX995

## 2020-12-15 SURGERY — MANIPULATION, KNEE, CLOSED
Anesthesia: General | Site: Knee | Laterality: Left

## 2020-12-15 MED ORDER — CHLORHEXIDINE GLUCONATE 0.12 % MT SOLN
OROMUCOSAL | Status: AC
  Filled 2020-12-15: qty 15

## 2020-12-15 MED ORDER — LIDOCAINE HCL (PF) 2 % IJ SOLN
INTRAMUSCULAR | Status: AC
  Filled 2020-12-15: qty 5

## 2020-12-15 MED ORDER — LIDOCAINE HCL (CARDIAC) PF 100 MG/5ML IV SOSY
PREFILLED_SYRINGE | INTRAVENOUS | Status: DC | PRN
  Administered 2020-12-15: 80 mg via INTRATRACHEAL

## 2020-12-15 MED ORDER — FAMOTIDINE 20 MG PO TABS
20.0000 mg | ORAL_TABLET | Freq: Once | ORAL | Status: AC
  Administered 2020-12-15: 20 mg via ORAL

## 2020-12-15 MED ORDER — LACTATED RINGERS IV SOLN: INTRAVENOUS | Status: DC

## 2020-12-15 MED ORDER — FENTANYL CITRATE (PF) 100 MCG/2ML IJ SOLN
INTRAMUSCULAR | Status: AC
  Filled 2020-12-15: qty 2

## 2020-12-15 MED ORDER — CHLORHEXIDINE GLUCONATE 0.12 % MT SOLN
15.0000 mL | Freq: Once | OROMUCOSAL | Status: AC
  Administered 2020-12-15: 15 mL via OROMUCOSAL

## 2020-12-15 MED ORDER — FAMOTIDINE 20 MG PO TABS
ORAL_TABLET | ORAL | Status: AC
  Filled 2020-12-15: qty 1

## 2020-12-15 MED ORDER — PROPOFOL 10 MG/ML IV BOLUS
INTRAVENOUS | Status: DC | PRN
  Administered 2020-12-15: 50 mg via INTRAVENOUS
  Administered 2020-12-15: 20 mg via INTRAVENOUS
  Administered 2020-12-15: 80 mg via INTRAVENOUS

## 2020-12-15 MED ORDER — FENTANYL CITRATE (PF) 100 MCG/2ML IJ SOLN
INTRAMUSCULAR | Status: DC | PRN
  Administered 2020-12-15 (×2): 25 ug via INTRAVENOUS
  Administered 2020-12-15: 50 ug via INTRAVENOUS

## 2020-12-15 MED ORDER — PROPOFOL 10 MG/ML IV BOLUS
INTRAVENOUS | Status: AC
  Filled 2020-12-15: qty 20

## 2020-12-15 MED ORDER — ORAL CARE MOUTH RINSE: 15.0000 mL | Freq: Once | OROMUCOSAL | Status: AC

## 2020-12-15 MED ORDER — HYDROCODONE-ACETAMINOPHEN 5-325 MG PO TABS: 1.0000 | ORAL_TABLET | ORAL | 0 refills | Status: DC | PRN

## 2020-12-15 SURGICAL SUPPLY — 3 items
KIT TURNOVER KIT A (KITS) ×1 IMPLANT
MANIFOLD NEPTUNE II (INSTRUMENTS) ×1 IMPLANT
WATER STERILE IRR 500ML POUR (IV SOLUTION) ×1 IMPLANT

## 2020-12-15 NOTE — Anesthesia Preprocedure Evaluation (Signed)
Anesthesia Evaluation  Patient identified by MRN, date of birth, ID band Patient awake    Reviewed: Allergy & Precautions, NPO status , Patient's Chart, lab work & pertinent test results, reviewed documented beta blocker date and time   Airway Mallampati: III  TM Distance: >3 FB     Dental  (+) Missing,    Pulmonary former smoker,    Pulmonary exam normal        Cardiovascular Exercise Tolerance: Good hypertension, Pt. on home beta blockers and Pt. on medications Normal cardiovascular exam  Left Ventricular Hypertrophy   Neuro/Psych Anxiety negative neurological ROS     GI/Hepatic negative GI ROS, Neg liver ROS,   Endo/Other  negative endocrine ROS  Renal/GU negative Renal ROS  negative genitourinary   Musculoskeletal  (+) Arthritis , Osteoarthritis,    Abdominal   Peds negative pediatric ROS (+)  Hematology  (+) anemia , REFUSES BLOOD PRODUCTS,   Anesthesia Other Findings Anemia    Anxiety    Basal cell carcinoma  left cheek and left forearm  History of shingles    Hypertension    Osteoarthritis    Psoriasis    Rhinitis    Shingles      Reproductive/Obstetrics                            Anesthesia Physical  Anesthesia Plan  ASA: 2  Anesthesia Plan: General   Post-op Pain Management:    Induction: Intravenous  PONV Risk Score and Plan: 2 and Propofol infusion  Airway Management Planned: LMA  Additional Equipment:   Intra-op Plan:   Post-operative Plan: Extubation in OR  Informed Consent: I have reviewed the patients History and Physical, chart, labs and discussed the procedure including the risks, benefits and alternatives for the proposed anesthesia with the patient or authorized representative who has indicated his/her understanding and acceptance.     Dental advisory given  Plan Discussed with: CRNA, Anesthesiologist and Surgeon  Anesthesia Plan Comments:         Anesthesia Quick Evaluation

## 2020-12-15 NOTE — H&P (Signed)
Chief Complaint  Patient presents with   Left Knee - Post Operative Visit    History of the Present Illness: Nathan Boone is a 75 y.o. male here today.   The patient presents for follow-up evaluation status post left total knee arthroplasty preformed on 11/13/2020. He has been having difficulty with motion, only about 70 degrees of flexion and 15 degree extension contracture. He is stiff preoperative follow-up visit today with x-ray.  The patient states he feels something moving in his left knee.  I have reviewed past medical, surgical, social and family history, and allergies as documented in the EMR.  Past Medical History: Past Medical History:  Diagnosis Date   Allergic rhinitis   Anemia   Anxiety   Essential hypertension, benign   History of shingles   Osteoarthritis   Psoriasis  Dr. Sharlett Iles   Psoriatic arthritis (CMS-HCC)   Renal insufficiency   Past Surgical History: Past Surgical History:  Procedure Laterality Date   Basel Cell Carcinoma Excision Left Cheek 10/2013   COLONOSCOPY   EXTRACTION CATARACT EXTRACAPSULAR W/INSERTION INTRAOCULAR PROSTHESIS Left 06/11/2020  Procedure: Left Lensx, ORA - EXTRACTION CATARACT EXTRACAPSULAR WITH PHACO WITH INSERTION INTRAOCULAR PROSTHESIS; Surgeon: Netta Corrigan, MD; Location: [facility]; Service: Ophthalmology; Laterality: Left;   EXTRACTION CATARACT EXTRACAPSULAR W/INSERTION INTRAOCULAR PROSTHESIS Right 06/25/2020  Procedure: Right Lensx/ORA EXTRACTION CATARACT EXTRACAPSULAR WITH PHACO WITH INSERTION INTRAOCULAR PROSTHESIS; Surgeon: Netta Corrigan, MD; Location: [facility]; Service: Ophthalmology; Laterality: Right;   Hand surgery Right   KNEE ARTHROSCOPY Right   Knee arthroscopy with partial medial and lateral menisectomy with partial synovectomy Left 08/18/2017  Dr.Jazlynn Nemetz   knee surgeries  3x   REPLACEMENT TOTAL KNEE Left 11/13/2020  Praise Dolecki   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   Past Family  History: Family History  Problem Relation Age of Onset   High blood pressure (Hypertension) Mother   Heart failure Mother   Vision loss Mother   Kidney disease Father   Cancer Father   Coronary Artery Disease (Blocked arteries around heart) Son   Other Other  "rotator cuff problems"   Glaucoma Neg Hx   Macular degeneration Neg Hx   Anesthesia problems Neg Hx   Medications: Current Outpatient Medications Ordered in Epic  Medication Sig Dispense Refill   HYDROcodone-acetaminophen (NORCO) 5-325 mg tablet Take 1-2 tablets by mouth every 6 (six) hours as needed 30 tablet 0   HYDROcodone-acetaminophen (NORCO) 5-325 mg tablet Take 1-2 tablets by mouth every 6 (six) hours as needed 60 tablet 0   metoprolol succinate (TOPROL-XL) 50 MG XL tablet TAKE 1/2 TABLET (25 MG TOTAL) BY MOUTH ONCE DAILY 45 tablet 3   sildenafil (REVATIO) 20 mg tablet Take 1-5 tab as needed 90 tablet 0   terazosin (HYTRIN) 10 MG capsule TAKE 1 CAPSULE BY MOUTH EVERY DAY 90 capsule 1   triamcinolone 0.1 % cream APPLY TO AFFECTED AREA TWICE A DAY AS NEEDED FOR FLARES   No current Epic-ordered facility-administered medications on file.   Allergies: Allergies  Allergen Reactions   Augmentin [Amoxicillin-Pot Clavulanate] Swelling   Oxycodone Hives   Penicillins Unknown   Tetracyclines Rash   Zithromax [Azithromycin] Unknown    Body mass index is 30.87 kg/m.  Review of Systems: A comprehensive 14 point ROS was performed, reviewed, and the pertinent orthopaedic findings are documented in the HPI.  There were no vitals filed for this visit.   General Physical Examination:   General/Constitutional: No apparent distress: well-nourished and well developed. Eyes: Pupils equal, round  with synchronous movement. Lungs: Clear to auscultation HEENT: Normal Vascular: No edema, swelling or tenderness, except as noted in detailed exam. Cardiac: Heart rate and rhythm is regular. Integumentary: No impressive skin lesions  present, except as noted in detailed exam. Neuro/Psych: Normal mood and affect, oriented to person, place, and time.  On exam, left knee stiffness and arthrofibrosis. Left knee range of motion is approximately 10 to 50 degrees; however, for therapy he gets back to 70 to 73 after working it.  Radiographs:  AP, lateral, standing, and sunrise x-rays of the left knee were ordered and personally reviewed today. These show good component position and alignment. Normal tracking of the patella. There is swelling visible on the x-ray of soft tissue lines.  X-ray Impression Stable appearance of left total knee with some effusion.  Assessment: ICD-10-CM  1. 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 Primary osteoarthritis of left knee M17.12  3. Chronic pain of left knee M25.562  G89.29  4. Effusion of left knee M25.462  5. 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  We discussed the patient's x-ray findings. I recommend left knee manipulation. I advised the patient to hold off on therapy until his next appointment.  We will schedule the patient for left knee manipulation in the near future.  Scribe Attestation: I, Dawn Royse, am acting as scribe for [employer], MD.   Electronically signed by Lauris Poag, MD at 12/10/2020 8:43 PM EDT  Reviewed  H+P. No changes noted.

## 2020-12-15 NOTE — Op Note (Signed)
12/15/2020  2:06 PM  PATIENT:  Nathan Boone  75 y.o. male  PRE-OPERATIVE DIAGNOSIS:  Arthrofibrosis of knee joint, left  M24.662 Status post total left knee replacement  Z96.652  POST-OPERATIVE DIAGNOSIS:  Arthrofibrosis of knee joint, left  M24.662 Status post total left knee replacement  Z96.652  PROCEDURE:  Procedure(s): CLOSED MANIPULATION KNEE (Left)  SURGEON: Laurene Footman, MD  ASSISTANTS: none  ANESTHESIA:   general  EBL:  No intake/output data recorded.  BLOOD ADMINISTERED:none  DRAINS: none   LOCAL MEDICATIONS USED:  NONE  SPECIMEN:  No Specimen  DISPOSITION OF SPECIMEN:  N/A  COUNTS:  NO closed procedure no count required  TOURNIQUET:  * No tourniquets in log *  IMPLANTS: None  DICTATION: .Dragon Dictation patient was brought to the operating room and after adequate anesthesia was obtained appropriate patient identification and timeout procedures were completed.  The leg was brought up into flexion and passively flexion was approximately 65 degrees with gentle pressure audible popping was noted in flexion can be brought back to 100 degrees followed by full extension.  Gently moving the knee back-and-forth this maintain motion was easily maintained and the patient was then sent to recovery stable condition no complications  PLAN OF CARE: Discharge to home after PACU  PATIENT DISPOSITION:  PACU - hemodynamically stable.

## 2020-12-15 NOTE — Discharge Instructions (Addendum)
Work on motion is much as you can Take pain medicine so that he can work on the motion [facility] to knee is much as you feel like it needs Call office because he should have physical therapy appointment set for this week  [place]   The drugs that you were given will stay in your system until tomorrow so for the next 24 hours you should not:  Drive an automobile Make any legal decisions Drink any alcoholic beverage   You may resume regular meals tomorrow.  Today it is better to start with liquids and gradually work up to solid foods.  You may eat anything you prefer, but it is better to start with liquids, then soup and crackers, and gradually work up to solid foods.   Please notify your doctor immediately if you have any unusual bleeding, trouble breathing, redness and pain at the surgery site, drainage, fever, or pain not relieved by medication.    Additional Instructions:   Please contact your physician with any problems or Same Day Surgery at [phone], Monday through Friday 6 am to 4 pm, or [place] at [facility] number at [phone].

## 2020-12-15 NOTE — Anesthesia Postprocedure Evaluation (Signed)
Anesthesia Post Note  Patient: Nathan Boone  Procedure(s) Performed: CLOSED MANIPULATION KNEE (Left: Knee)  Patient location during evaluation: PACU Anesthesia Type: General Level of consciousness: awake and alert Pain management: pain level controlled Vital Signs Assessment: post-procedure vital signs reviewed and stable Respiratory status: spontaneous breathing, nonlabored ventilation, respiratory function stable and patient connected to nasal cannula oxygen Cardiovascular status: stable and blood pressure returned to baseline Postop Assessment: no apparent nausea or vomiting Anesthetic complications: no   No notable events documented.   Last Vitals:  Vitals:   12/15/20 1303 12/15/20 1409  BP: 124/68 (!) 147/68  Pulse: 78 67  Resp: 18 13  Temp: 36.9 C 36.6 C  SpO2: 98% 100%    Last Pain:  Vitals:   12/15/20 1409  TempSrc:   PainSc: [place]

## 2020-12-15 NOTE — Transfer of Care (Signed)
Immediate Anesthesia Transfer of Care Note  Patient: Nathan Boone  Procedure(s) Performed: CLOSED MANIPULATION KNEE (Left: Knee)  Patient Location: PACU  Anesthesia Type:General  Level of Consciousness: awake, alert  and oriented  Airway & Oxygen Therapy: Patient Spontanous Breathing and Patient connected to face mask oxygen  Post-op Assessment: Report given to RN and Post -op Vital signs reviewed and stable  Post vital signs: Reviewed and stable  Last Vitals:  Vitals Value Taken Time  BP 147/68 12/15/20 1408  Temp    Pulse 65 12/15/20 1410  Resp 15 12/15/20 1410  SpO2 100 % 12/15/20 1410  Vitals shown include unvalidated device data.  Last Pain:  Vitals:   12/15/20 1303  TempSrc: Temporal  PainSc: 0-No pain         Complications: No notable events documented.

## 2020-12-16 ENCOUNTER — Encounter: Payer: Self-pay | Admitting: Orthopedic Surgery

## 2020-12-17 LAB — URINE CULTURE: Culture: >=100000 — AB

## 2022-01-27 IMAGING — CT CT KNEE*L* W/O CM
3 of 5 series · 12 of 33 positions shown, 14 images · non-contrast
Comparison: None.

CLINICAL DATA: Left knee pain, osteoarthritis. Pre-surgical
planning

EXAM:
CT OF THE LEFT KNEE WITHOUT CONTRAST
TECHNIQUE: Multidetector CT imaging of the left knee was performed according to
the standard protocol. Multiplanar CT image reconstructions were
also generated. Axial bone window images were obtained through the
left hip and left ankle as part of the pre-surgical planning
protocol.

[Series 6: axial st · axial · 0.39mm/px · z∈[+794,+1018]mm · 4 of 300 slices shown, 5 images]
[im 38/300  soft-tissue]
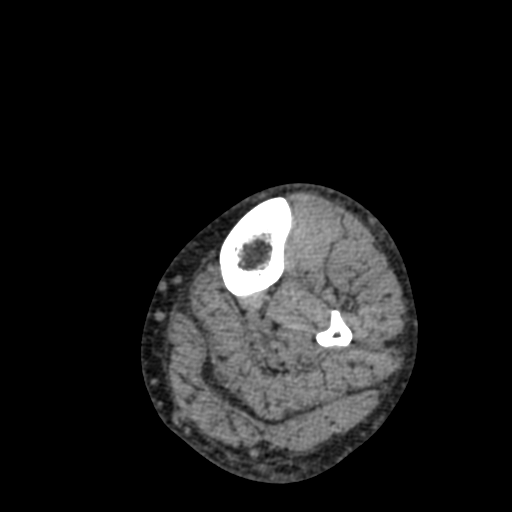
[im 38/300  bone]
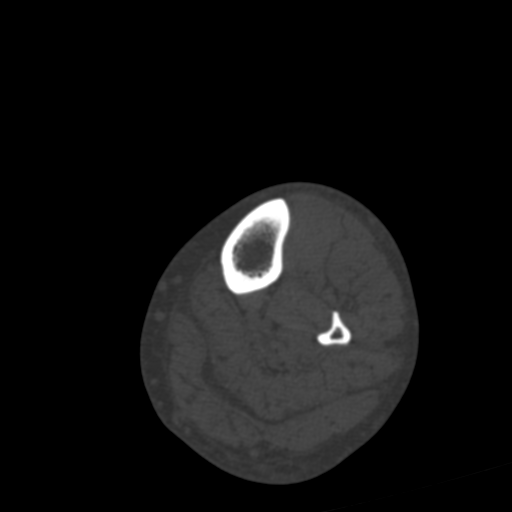
[im 113/300  bone]
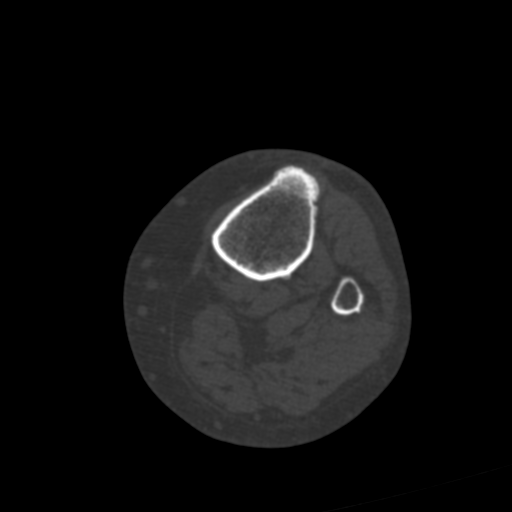
[im 187/300  bone]
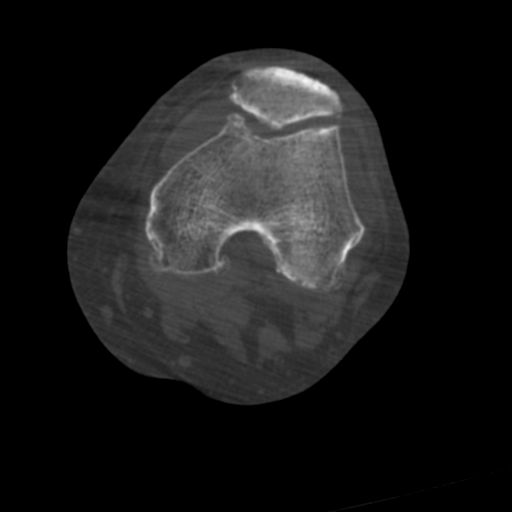
[im 262/300  bone]
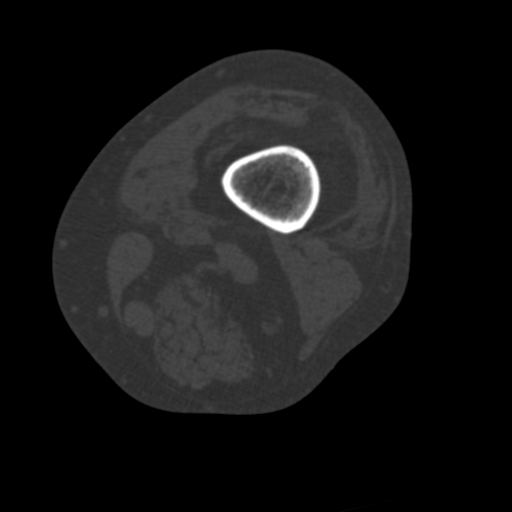

[Series 9: coronal st · coronal · 0.33mm/px · 3 of 107 slices shown]
[im 22/107  bone]
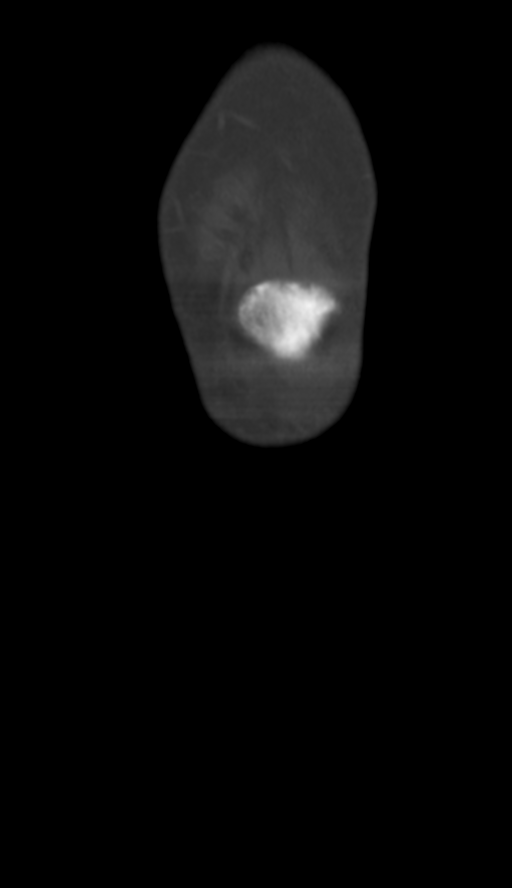
[im 43/107  bone]
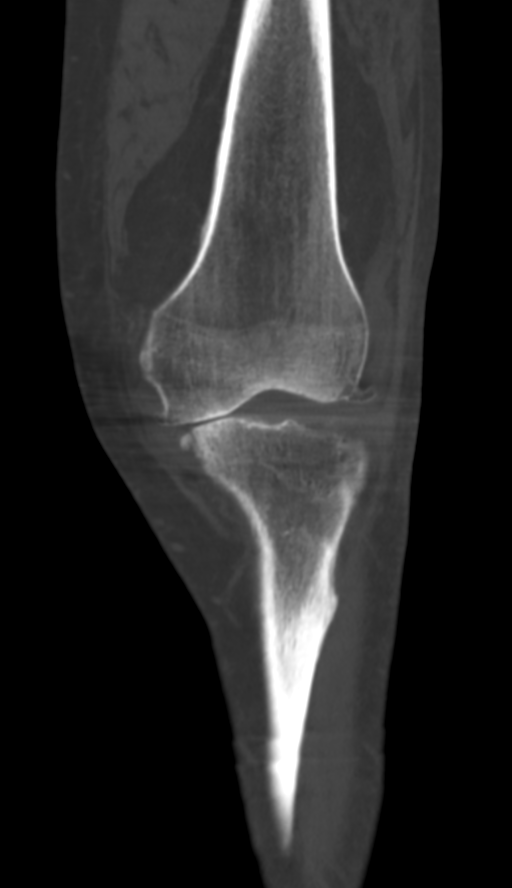
[im 64/107  bone]
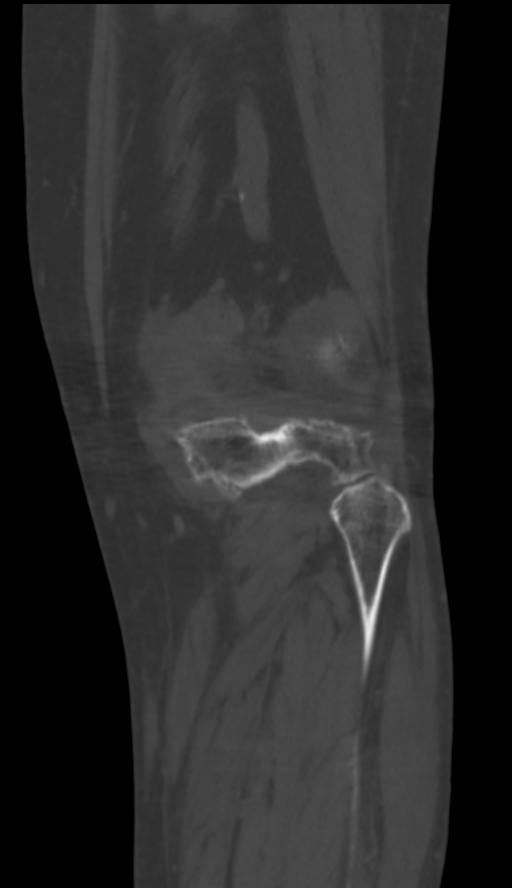

[Series 10: sagittal st · sagittal · 0.41mm/px · 5 of 86 slices shown, 6 images]
[im 29/86  bone]
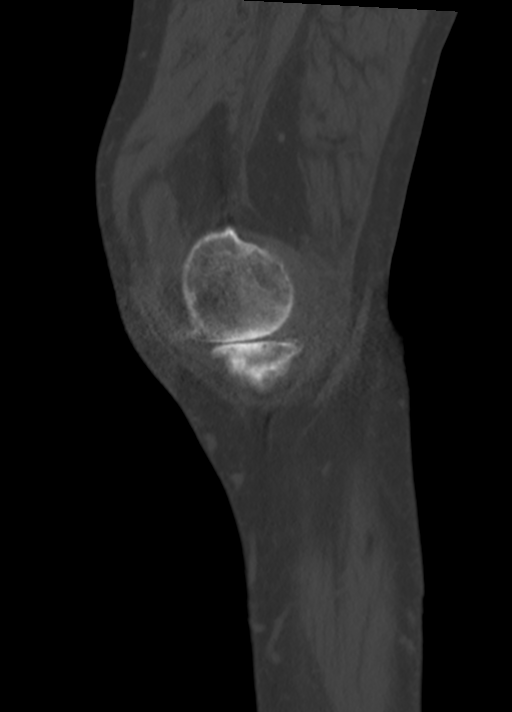
[im 36/86  bone]
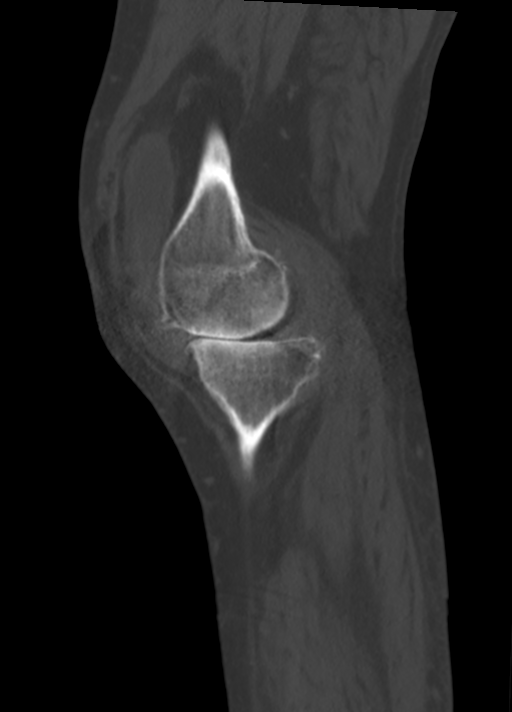
[im 43/86  soft-tissue]
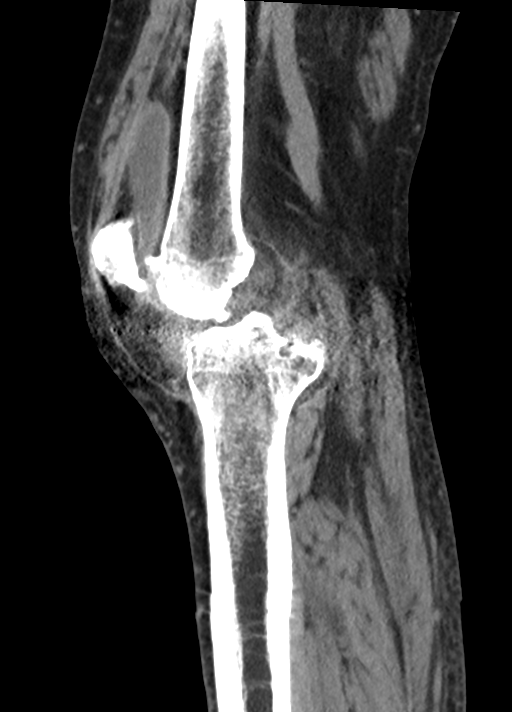
[im 43/86  bone]
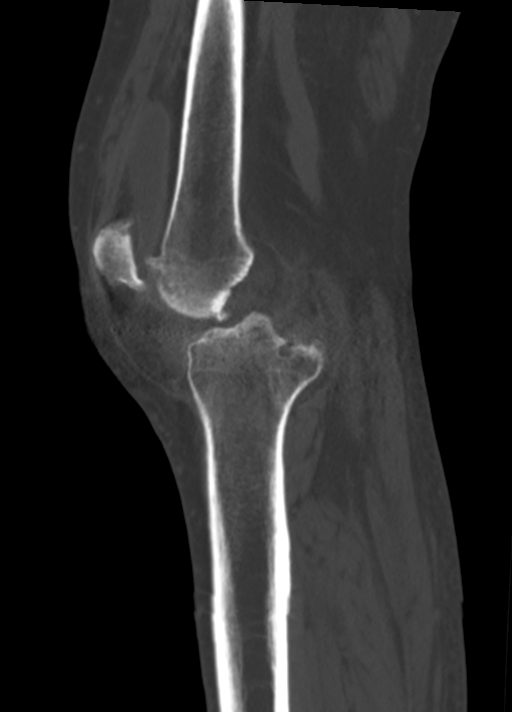
[im 50/86  bone]
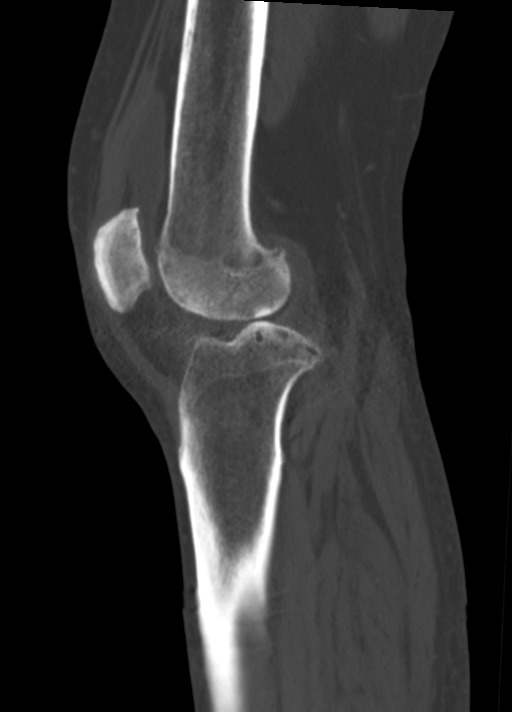
[im 57/86  bone]
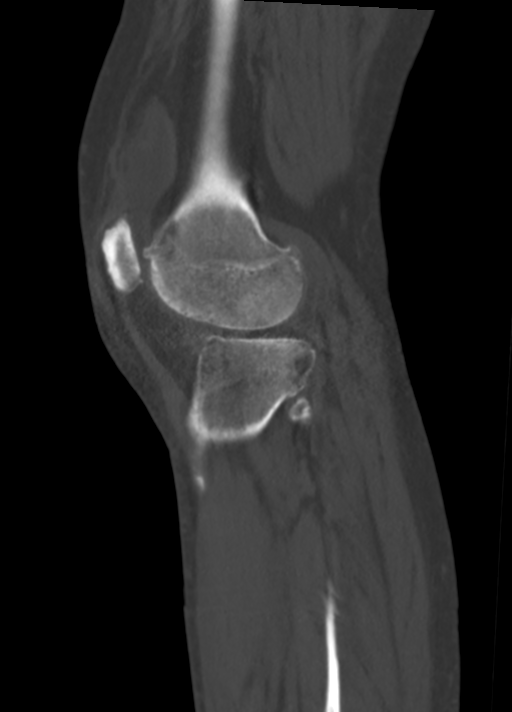

[12 of 33 positions shown; findings below may reference images not displayed]

FINDINGS: Bones/Joint/Cartilage

Left knee: No acute fracture or dislocation. Tricompartmental
osteoarthritis, severe within the medial compartment with complete
joint space loss, subchondral sclerosis/cystic change, and marginal
osteophyte formation. Moderate size knee joint effusion. Small
Baker's cyst.

Left hip: No acute fracture or dislocation.  Mild arthropathy.

Left ankle: No acute fracture or dislocation. No significant
arthropathy.

Ligaments

Suboptimally assessed by CT.

Muscles and Tendons

No acute musculotendinous abnormality by CT. Preserved bulk without
atrophy or fatty infiltration.

Soft tissues

Mild prepatellar subcutaneous edema, nonspecific. No fluid
collections. Vascular calcifications are present.
IMPRESSION: 1. Tricompartmental osteoarthritis of the left knee, severe within
the medial compartment.
2. Moderate size knee joint effusion.
3. Small Baker's cyst.

## 2022-02-09 ENCOUNTER — Other Ambulatory Visit: Payer: Self-pay | Admitting: Orthopedic Surgery

## 2022-02-16 ENCOUNTER — Encounter: Payer: Self-pay | Admitting: Orthopedic Surgery

## 2022-02-23 ENCOUNTER — Encounter
Admission: RE | Disposition: A | Discharge status: Home / Self Care | Payer: Self-pay | Source: Home / Self Care | Attending: Orthopedic Surgery

## 2022-02-23 ENCOUNTER — Ambulatory Visit
Admission: RE | Admit: 2022-02-23 | Discharge: 2022-02-23 | Disposition: A | Discharge status: Home / Self Care | Payer: Medicare Other | Attending: Orthopedic Surgery | Admitting: Orthopedic Surgery

## 2022-02-23 ENCOUNTER — Other Ambulatory Visit: Payer: Self-pay

## 2022-02-23 ENCOUNTER — Encounter: Payer: Self-pay | Admitting: Orthopedic Surgery

## 2022-02-23 ENCOUNTER — Ambulatory Visit: Payer: Medicare Other | Admitting: Anesthesiology

## 2022-02-23 DIAGNOSIS — Z87891 Personal history of nicotine dependence: Secondary | ICD-10-CM | POA: Diagnosis not present

## 2022-02-23 DIAGNOSIS — G5601 Carpal tunnel syndrome, right upper limb: Secondary | ICD-10-CM | POA: Insufficient documentation

## 2022-02-23 DIAGNOSIS — Z85828 Personal history of other malignant neoplasm of skin: Secondary | ICD-10-CM | POA: Diagnosis not present

## 2022-02-23 DIAGNOSIS — Z79899 Other long term (current) drug therapy: Secondary | ICD-10-CM | POA: Diagnosis not present

## 2022-02-23 DIAGNOSIS — Z96652 Presence of left artificial knee joint: Secondary | ICD-10-CM | POA: Diagnosis not present

## 2022-02-23 DIAGNOSIS — M1990 Unspecified osteoarthritis, unspecified site: Secondary | ICD-10-CM | POA: Diagnosis not present

## 2022-02-23 DIAGNOSIS — F419 Anxiety disorder, unspecified: Secondary | ICD-10-CM | POA: Diagnosis not present

## 2022-02-23 DIAGNOSIS — L4050 Arthropathic psoriasis, unspecified: Secondary | ICD-10-CM | POA: Insufficient documentation

## 2022-02-23 DIAGNOSIS — I10 Essential (primary) hypertension: Secondary | ICD-10-CM | POA: Diagnosis not present

## 2022-02-23 HISTORY — PX: CARPAL TUNNEL RELEASE: SHX101

## 2022-02-23 SURGERY — CARPAL TUNNEL RELEASE
Anesthesia: Choice | Site: Hand | Laterality: Right

## 2022-02-23 MED ORDER — LACTATED RINGERS IV SOLN: INTRAVENOUS | Status: DC

## 2022-02-23 MED ORDER — METOCLOPRAMIDE HCL 5 MG/ML IJ SOLN: 5.0000 mg | Freq: Three times a day (TID) | INTRAMUSCULAR | Status: DC | PRN

## 2022-02-23 MED ORDER — ONDANSETRON HCL 4 MG/2ML IJ SOLN: 4.0000 mg | Freq: Four times a day (QID) | INTRAMUSCULAR | Status: DC | PRN

## 2022-02-23 MED ORDER — OXYCODONE HCL 5 MG/5ML PO SOLN: 5.0000 mg | Freq: Once | ORAL | Status: DC | PRN

## 2022-02-23 MED ORDER — BUPIVACAINE HCL 0.5 % IJ SOLN
INTRAMUSCULAR | Status: DC | PRN
  Administered 2022-02-23: 10 mL

## 2022-02-23 MED ORDER — OXYCODONE HCL 5 MG PO TABS: 5.0000 mg | ORAL_TABLET | Freq: Once | ORAL | Status: DC | PRN

## 2022-02-23 MED ORDER — CEFAZOLIN SODIUM-DEXTROSE 2-4 GM/100ML-% IV SOLN: 2.0000 g | INTRAVENOUS | Status: DC

## 2022-02-23 MED ORDER — FENTANYL CITRATE PF 50 MCG/ML IJ SOSY: 25.0000 ug | PREFILLED_SYRINGE | INTRAMUSCULAR | Status: DC | PRN

## 2022-02-23 MED ORDER — PROPOFOL 500 MG/50ML IV EMUL
INTRAVENOUS | Status: DC | PRN
  Administered 2022-02-23: 130 mg via INTRAVENOUS
  Administered 2022-02-23: 20 mg via INTRAVENOUS

## 2022-02-23 MED ORDER — 0.9 % SODIUM CHLORIDE (POUR BTL) OPTIME
TOPICAL | Status: DC | PRN
  Administered 2022-02-23: 50 mL

## 2022-02-23 MED ORDER — HYDROCODONE-ACETAMINOPHEN 7.5-325 MG PO TABS: 1.0000 | ORAL_TABLET | ORAL | Status: DC | PRN

## 2022-02-23 MED ORDER — ONDANSETRON HCL 4 MG PO TABS: 4.0000 mg | ORAL_TABLET | Freq: Four times a day (QID) | ORAL | Status: DC | PRN

## 2022-02-23 MED ORDER — ACETAMINOPHEN 325 MG PO TABS: 325.0000 mg | ORAL_TABLET | Freq: Four times a day (QID) | ORAL | Status: DC | PRN

## 2022-02-23 MED ORDER — ACETAMINOPHEN 10 MG/ML IV SOLN: 1000.0000 mg | Freq: Once | INTRAVENOUS | Status: DC | PRN

## 2022-02-23 MED ORDER — METOCLOPRAMIDE HCL 5 MG PO TABS: 5.0000 mg | ORAL_TABLET | Freq: Three times a day (TID) | ORAL | Status: DC | PRN

## 2022-02-23 MED ORDER — MORPHINE SULFATE (PF) 2 MG/ML IV SOLN: 0.5000 mg | INTRAVENOUS | Status: DC | PRN

## 2022-02-23 MED ORDER — SODIUM CHLORIDE 0.9 % IV SOLN: INTRAVENOUS | Status: DC

## 2022-02-23 MED ORDER — ONDANSETRON HCL 4 MG/2ML IJ SOLN
INTRAMUSCULAR | Status: DC | PRN
  Administered 2022-02-23: 4 mg via INTRAVENOUS

## 2022-02-23 MED ORDER — HYDROCODONE-ACETAMINOPHEN 5-325 MG PO TABS
1.0000 | ORAL_TABLET | ORAL | Status: DC | PRN
  Administered 2022-02-23: 1 via ORAL

## 2022-02-23 SURGICAL SUPPLY — 19 items
APL PRP STRL LF DISP 70% ISPRP (MISCELLANEOUS) ×1
BNDG CMPR STD VLCR NS LF 5.8X3 (GAUZE/BANDAGES/DRESSINGS) ×1
BNDG ELASTIC 3X5.8 VLCR NS LF (GAUZE/BANDAGES/DRESSINGS) IMPLANT
CHLORAPREP W/TINT 26 (MISCELLANEOUS) ×1 IMPLANT
COVER LIGHT HANDLE UNIVERSAL (MISCELLANEOUS) IMPLANT
GAUZE SPONGE 4X4 12PLY STRL (GAUZE/BANDAGES/DRESSINGS) ×1 IMPLANT
GAUZE XEROFORM 1X8 LF (GAUZE/BANDAGES/DRESSINGS) ×1 IMPLANT
GLOVE SURG SYN 9.0  PF PI (GLOVE) ×2
GLOVE SURG SYN 9.0 PF PI (GLOVE) ×1 IMPLANT
GOWN STRL REUS W/ TWL LRG LVL3 (GOWN DISPOSABLE) ×1 IMPLANT
GOWN STRL REUS W/TWL LRG LVL3 (GOWN DISPOSABLE) ×2
KIT TURNOVER KIT A (KITS) ×1 IMPLANT
NS IRRIG 500ML POUR BTL (IV SOLUTION) ×1 IMPLANT
PACK EXTREMITY ARMC (MISCELLANEOUS) ×1 IMPLANT
PAD CAST 4YDX4 CTTN HI CHSV (CAST SUPPLIES) ×1 IMPLANT
PADDING CAST COTTON 4X4 STRL (CAST SUPPLIES) ×1
SUT ETHILON 4-0 (SUTURE) ×1
SUT ETHILON 4-0 FS2 18XMFL BLK (SUTURE) ×1
SUTURE ETHLN 4-0 FS2 18XMF BLK (SUTURE) ×1 IMPLANT

## 2022-02-23 NOTE — Transfer of Care (Signed)
Immediate Anesthesia Transfer of Care Note  Patient: Nathan Boone  Procedure(s) Performed: CARPAL TUNNEL RELEASE (Right: Hand)  Patient Location: PACU  Anesthesia Type: No value filed.  Level of Consciousness: awake, alert  and patient cooperative  Airway and Oxygen Therapy: Patient Spontanous Breathing and Patient connected to supplemental oxygen  Post-op Assessment: Post-op Vital signs reviewed, Patient's Cardiovascular Status Stable, Respiratory Function Stable, Patent Airway and No signs of Nausea or vomiting  Post-op Vital Signs: Reviewed and stable  Complications: No notable events documented.

## 2022-02-23 NOTE — Discharge Instructions (Signed)
Keep arm elevated Work on finger motion as much as you can Pain medicine as directed Loosen ace wrap if finger swell

## 2022-02-23 NOTE — Op Note (Signed)
02/23/2022  1:29 PM  PATIENT:  Nathan Boone  76 y.o. 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  POST-OPERATIVE DIAGNOSIS:  Carpal tunnel syndrome, right  PROCEDURE:  Procedure(s): CARPAL TUNNEL RELEASE (Right)  SURGEON: Laurene Footman, MD  ASSISTANTS: none  ANESTHESIA:   general  EBL:  Total I/O In: 800 [I.V.:800] Out: 0   BLOOD ADMINISTERED:none  DRAINS: none   LOCAL MEDICATIONS USED:  MARCAINE     SPECIMEN:  No Specimen  DISPOSITION OF SPECIMEN:  N/A  COUNTS:  YES  TOURNIQUET:   Total Tourniquet Time Documented: Upper Arm (Right) - 12 minutes Total: Upper Arm (Right) - 12 minutes   IMPLANTS: none  DICTATION: .Dragon Dictation patient was brought to the operating room and after general anesthesia was obtained the right arm was prepped and draped in the usual sterile fashion.  Tourniquet was applied to the upper arm first.  After patient identification and timeout procedures were completed tourniquet was raised and incision made going through the prior incision in the palm and extending this ulnarly and then proximally to expose the distal forearm.  Approximately 2 and half centimeters in each direction from the wrist crease.  Subcutaneous tissue was spread and going from proximal to distal there was some scar around the nerve proximally and going distally there is very dense scar from just proximal to the wrist crease and through the entire rest of the carpal tunnel.  The nerve appeared to have compression from the scar after release of the scar at the nerve appeared to be intact without further compression proximally looking through the antebrachial fascia did not appear to be a compression there.  The wound was irrigated and tourniquet let down there is good vascular blush to the nerve.  The wound was then infiltrated with 10 cc of half percent Sensorcaine and then closed using simple erupted 5-0 nylon.  Xeroform 4 x 4 web roll and Ace wrap  applied.  PLAN OF CARE: Discharge to home after PACU  PATIENT DISPOSITION:  PACU - hemodynamically stable.

## 2022-02-23 NOTE — H&P (Signed)
Chief Complaint Patient presents with Right Wrist - Numbness, Pain   History of the Present Illness: Nathan Boone is a 76 y.o. male here today.  The patient presents for evaluation of right carpal tunnel syndrome. He had an EMG nerve conduction test on his right hand. He had a prior carpal tunnel release in 10/2020. It showed chronic severe right carpal tunnel. He had a nerve test earlier this week with neurology, and it showed increasing severity, so recurrence of carpal tunnel with worsening symptoms. He comes back today to discuss potential surgery.  The patient states he has throbbing pain in his right hand, and the pain is worse than it was before.  The patient takes hydrocodone 7.5 mg for pain.  He has hives to OXYCODONE.  The patient lives in [place].  I have reviewed past medical, surgical, social and family history, and allergies as documented in the EMR.  Past Medical History: Past Medical History: Diagnosis Date Allergic rhinitis Anemia Anxiety Essential hypertension, benign History of shingles Osteoarthritis Psoriasis Dr. 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 Knee arthroscopy with partial medial and lateral menisectomy with partial synovectomy Left 08/18/2017 Dr.Temple Boone EXTRACTION CATARACT EXTRACAPSULAR W/INSERTION INTRAOCULAR PROSTHESIS Left 06/11/2020 Procedure: Left Lensx, ORA - EXTRACTION CATARACT EXTRACAPSULAR WITH PHACO WITH INSERTION INTRAOCULAR PROSTHESIS; Surgeon: Nathan Corrigan, MD; Location: [facility]; Service: Ophthalmology; Laterality: Left; EXTRACTION CATARACT EXTRACAPSULAR W/INSERTION INTRAOCULAR PROSTHESIS Right 06/25/2020 Procedure: Right Lensx/ORA EXTRACTION CATARACT EXTRACAPSULAR WITH PHACO WITH INSERTION INTRAOCULAR PROSTHESIS; Surgeon: Nathan Corrigan, MD; Location: [facility]; Service:  Ophthalmology; Laterality: Right; REPLACEMENT TOTAL KNEE Left 11/13/2020 [facility] Cell Carcinoma Excision Left Cheek 10/2013 COLONOSCOPY Hand surgery Right KNEE ARTHROSCOPY Right knee surgeries 3x  Past Family History: Family History Problem Relation Age of Onset High blood pressure (Hypertension) Mother Heart failure Mother Vision loss Mother Kidney disease Father Cancer Father Coronary Artery Disease (Blocked arteries around heart) Son Other Other "rotator cuff problems" Glaucoma Neg Hx Macular degeneration Neg Hx Anesthesia problems Neg Hx  Medications: Current Outpatient Medications Ordered in Epic Medication Sig Dispense Refill allopurinoL (ZYLOPRIM) 100 MG tablet TAKE 1 TABLET BY MOUTH EVERY DAY 90 tablet 1 calcium carbonate (TUMS) 200 mg calcium (500 mg) chewable tablet Take 1 tablet by mouth as needed gabapentin (NEURONTIN) 300 MG capsule Take 1 capsule (300 mg total) by mouth at bedtime 30 capsule 1 HYDROcodone-acetaminophen (NORCO) 7.5-325 mg tablet Take 1 tablet by mouth every 6 (six) hours as needed for Pain 45 tablet 0 melatonin 10 mg Tab Take 1 tablet by mouth at bedtime as needed metoprolol succinate (TOPROL-XL) 50 MG XL tablet TAKE 1/2 TABLET (25 MG TOTAL) BY MOUTH ONCE DAILY 45 tablet 3 sildenafil (REVATIO) 20 mg tablet Take 1-5 tab as needed 90 tablet 0 terazosin (HYTRIN) 5 MG capsule Take 1 capsule (5 mg total) by mouth at bedtime 90 capsule 1 triamcinolone 0.1 % cream APPLY TO AFFECTED AREA TWICE A DAY AS NEEDED FOR FLARES  No current Epic-ordered facility-administered medications on file.  Allergies: Allergies Allergen Reactions Augmentin [Amoxicillin-Pot Clavulanate] Swelling Oxycodone Hives Penicillins Unknown Tetracyclines Rash Zithromax [Azithromycin] Unknown   Body mass index is 30.04 kg/m.  Review of Systems: A comprehensive 14 point ROS was performed, reviewed, and the pertinent orthopaedic findings are documented in the HPI.  There  were no vitals filed for this visit.  General Physical Examination:  General/Constitutional: No apparent distress: well-nourished and well developed. Eyes: Pupils equal, round  with synchronous movement. Lungs: Clear to auscultation HEENT: Normal Vascular: No edema, swelling or tenderness, except as noted in detailed exam. Cardiac: Heart rate and rhythm is regular. Integumentary: No impressive skin lesions present, except as noted in detailed exam. Neuro/Psych: Normal mood and affect, oriented to person, place and time.  On exam, positive Tinel sign on the right.  Radiographs:  No new imaging studies were obtained today  Assessment: ICD-10-CM 1. 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  We discussed the patient's prior EMG nerve conduction study findings. I recommend a repeat right carpal tunnel release under anesthesia. I explained the surgery and postoperative course in detail. I wrote him a prescription for hydrocodone 7.5 mg to take every 6 hours as needed.  We will schedule the patient for right carpal tunnel release in the near future.  Surgical Risks:  The nature of the condition and the proposed procedure has been reviewed in detail with the patient. Surgical versus non-surgical options and prognosis for recovery have been reviewed and the inherent risks and benefits of each have been discussed including the risks of infection, bleeding, injury to nerves/blood vessels/tendons, incomplete relief of symptoms, persisting pain and/or stiffness, loss of function, complex regional pain syndrome, failure of the procedure, as appropriate.  Teeth:  Document Attestation: I, Sowjanya Panditi, have reviewed and updated documentation for [facility], MD, utilizing [employer] DAX.  Electronically signed by Lauris Poag, MD at 02/04/2022 8:23 AM EDT  Reviewed   H+P. No changes noted.

## 2022-02-23 NOTE — Anesthesia Postprocedure Evaluation (Signed)
Anesthesia Post Note  Patient: COHL BEHRENS  Procedure(s) Performed: CARPAL TUNNEL RELEASE (Right: Hand)  Patient location during evaluation: PACU Anesthesia Type: MAC Level of consciousness: awake and alert Pain management: pain level controlled Vital Signs Assessment: post-procedure vital signs reviewed and stable Respiratory status: spontaneous breathing, nonlabored ventilation, respiratory function stable and patient connected to nasal cannula oxygen Cardiovascular status: stable and blood pressure returned to baseline Postop Assessment: no apparent nausea or vomiting Anesthetic complications: no  No notable events documented.   Last Vitals:  Vitals:   02/23/22 1332 02/23/22 1404  BP: (!) 160/84 (!) 162/70  Pulse:  (!) 49  Resp: 15 12  Temp: 36.6 C (!) 36.3 C  SpO2: 100% 99%    Last Pain:  Vitals:   02/23/22 1404  TempSrc:   PainSc: Ollie

## 2022-02-23 NOTE — Anesthesia Preprocedure Evaluation (Addendum)
Anesthesia Evaluation  Patient identified by MRN, date of birth, ID band Patient awake    Reviewed: Allergy & Precautions, NPO status , Patient's Chart, lab work & pertinent test results, reviewed documented beta blocker date and time   Airway Mallampati: III  TM Distance: >3 FB     Dental  (+) Missing,    Pulmonary former smoker   Pulmonary exam normal        Cardiovascular Exercise Tolerance: Good hypertension, Pt. on home beta blockers and Pt. on medications Normal cardiovascular exam  Left Ventricular Hypertrophy   Neuro/Psych   Anxiety     negative neurological ROS     GI/Hepatic negative GI ROS, Neg liver ROS,,,  Endo/Other  negative endocrine ROS    Renal/GU negative Renal ROS  negative genitourinary   Musculoskeletal  (+) Arthritis , Osteoarthritis,    Abdominal   Peds negative pediatric ROS (+)  Hematology  (+) Blood dyscrasia, anemia , REFUSES BLOOD PRODUCTS  Anesthesia Other Findings Anemia    Anxiety    Basal cell carcinoma  left cheek and left forearm  History of shingles    Hypertension    Osteoarthritis    Psoriasis    Rhinitis    Shingles      Reproductive/Obstetrics                             Anesthesia Physical Anesthesia Plan  ASA: 2  Anesthesia Plan: General   Post-op Pain Management:    Induction: Intravenous  PONV Risk Score and Plan: 2 and Ondansetron and Dexamethasone  Airway Management Planned: LMA  Additional Equipment:   Intra-op Plan:   Post-operative Plan: Extubation in OR  Informed Consent: I have reviewed the patients History and Physical, chart, labs and discussed the procedure including the risks, benefits and alternatives for the proposed anesthesia with the patient or authorized representative who has indicated his/her understanding and acceptance.     Dental advisory given  Plan Discussed with: CRNA, Anesthesiologist and  Surgeon  Anesthesia Plan Comments: (Patient consented for risks of anesthesia including but not limited to:  - adverse reactions to medications - damage to eyes, teeth, lips or other oral mucosa - nerve damage due to positioning  - sore throat or hoarseness - Damage to heart, brain, nerves, lungs, other parts of body or loss of life  Patient voiced understanding.)        Anesthesia Quick Evaluation

## 2022-02-24 ENCOUNTER — Encounter: Payer: Self-pay | Admitting: Orthopedic Surgery

## 2022-03-17 IMAGING — DX DG KNEE 1-2V*L*
2 series · 2 of 2 positions shown · non-contrast
Comparison: CT of the left knee on 09/25/2020

CLINICAL DATA: Status post left knee arthroplasty.

EXAM:
LEFT KNEE - 1-2 VIEW

[knee ap]
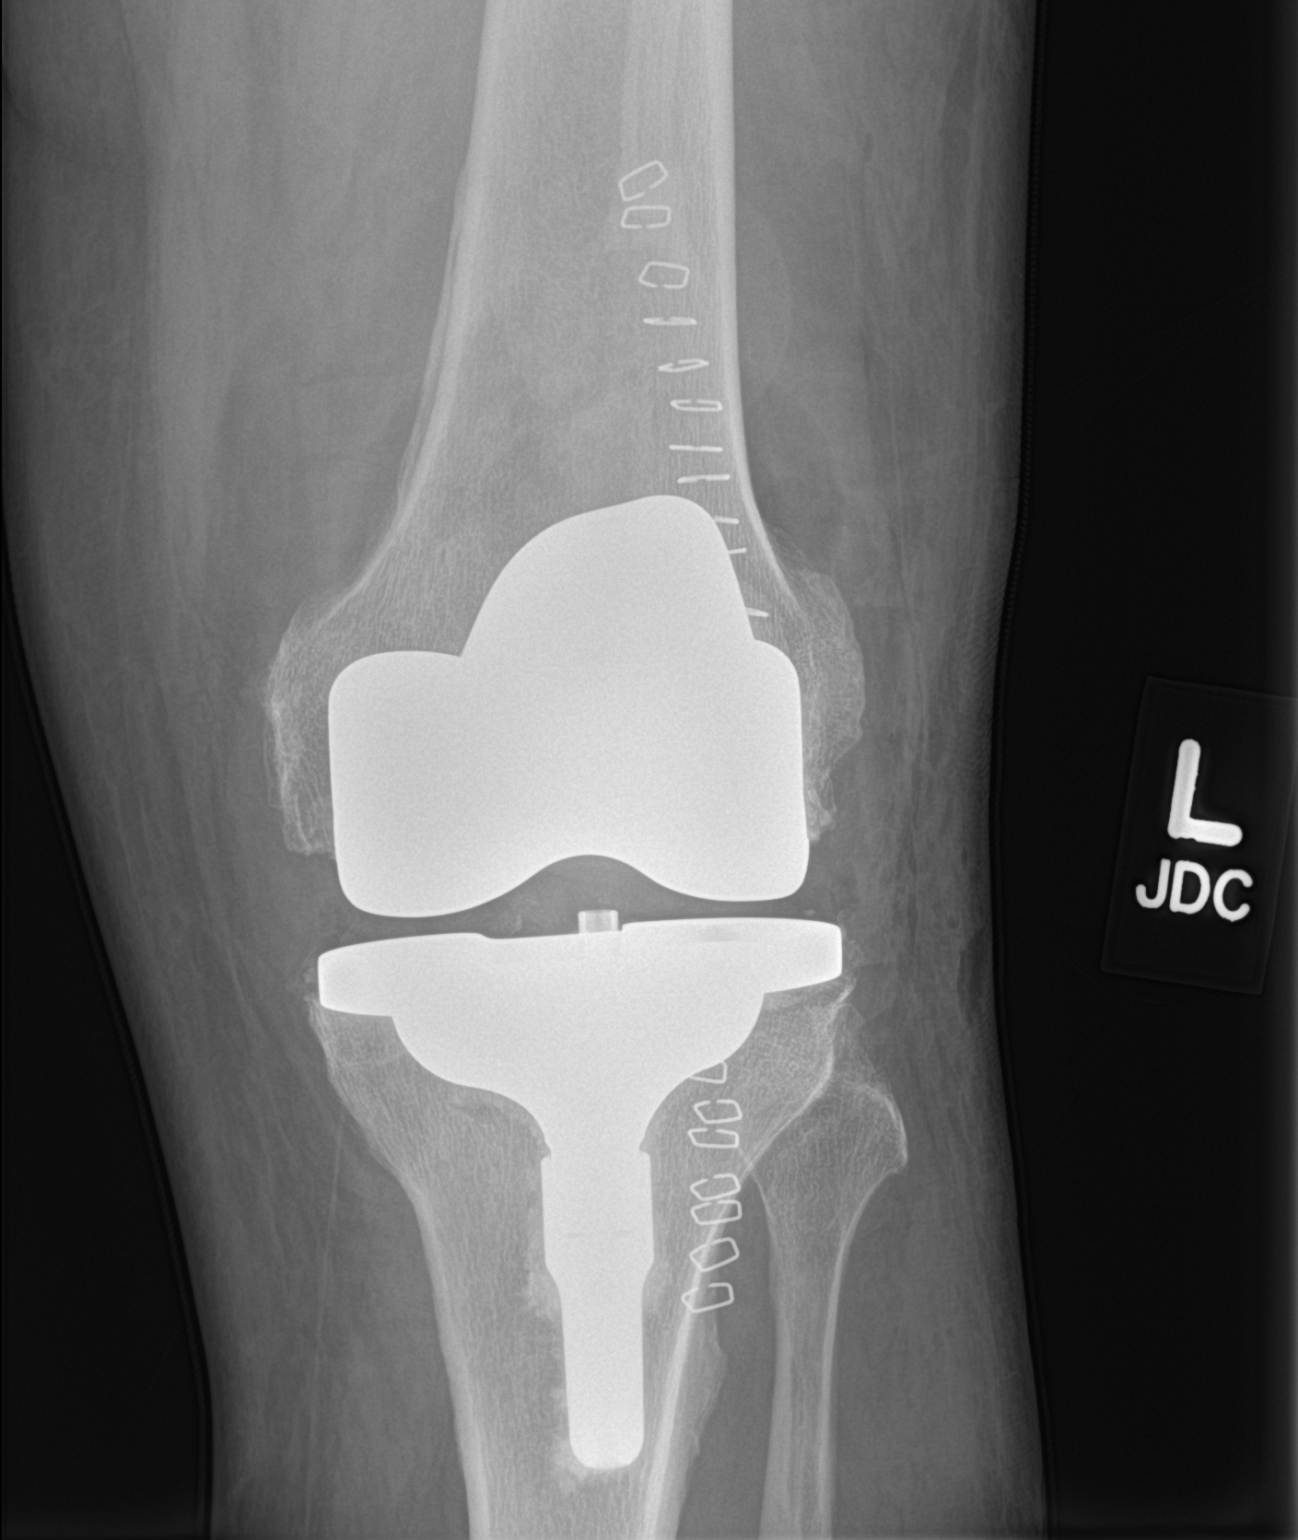

[knee lat]
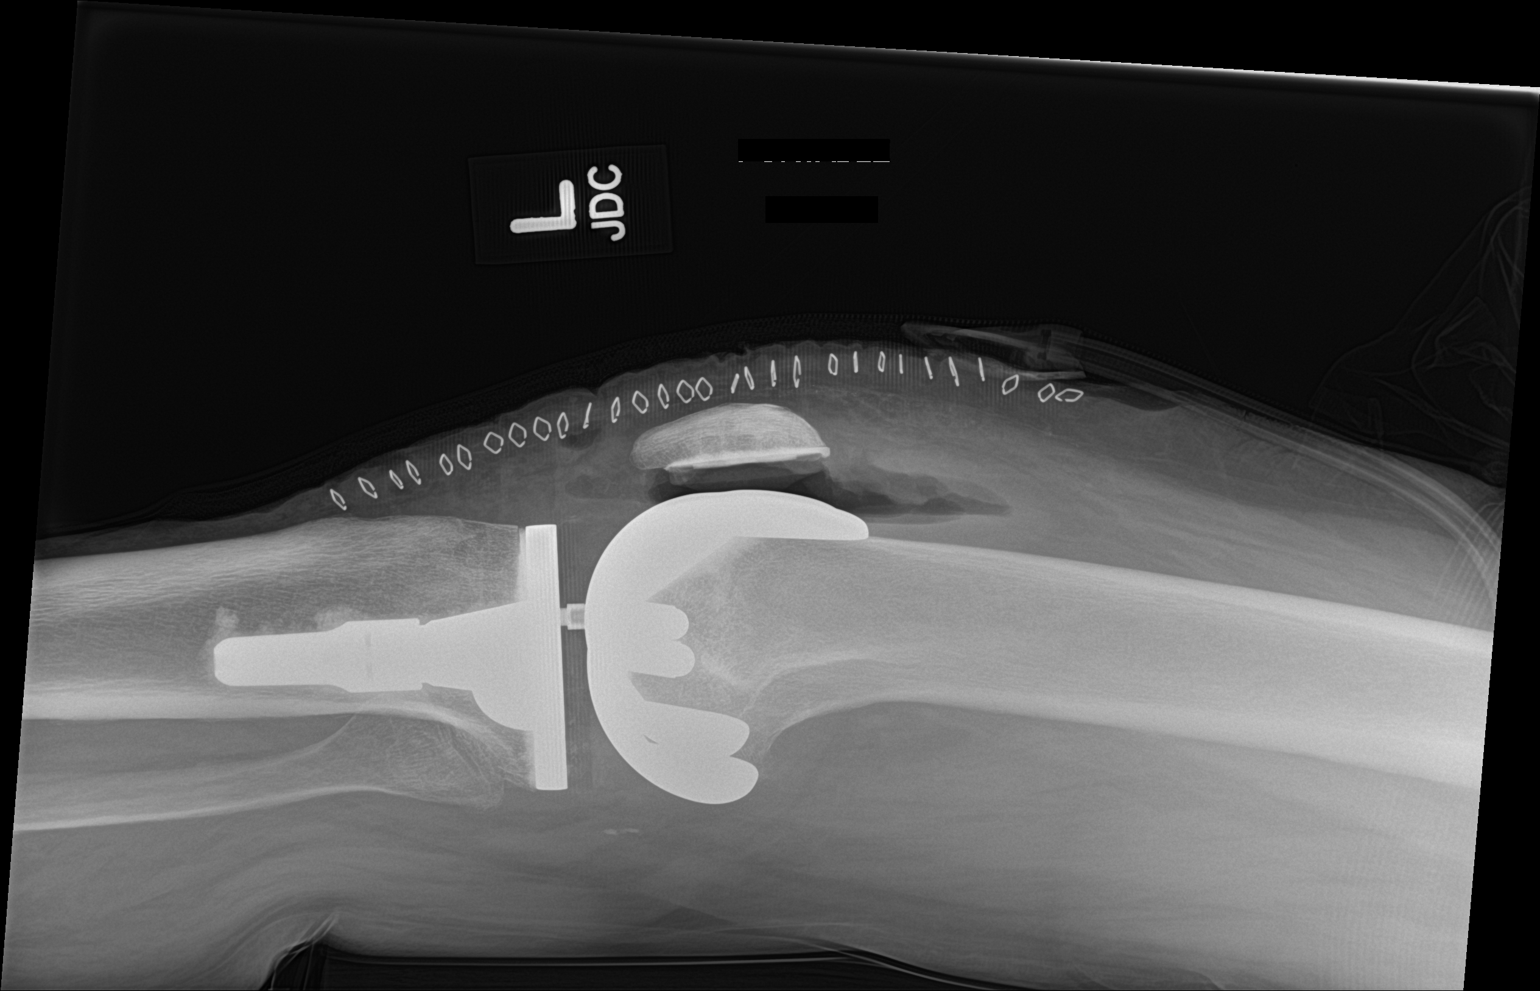

[2 of 2 positions shown; findings below may reference images not displayed]

FINDINGS: Status post total arthroplasty of the left knee. Femoral and tibial
components demonstrate normal alignment. Lateral view shows normal
positioning of the patella. Expected air and fluid in the joint
space.
IMPRESSION: Normal alignment and radiographic appearance following left total
knee arthroplasty.

## 2022-09-27 ENCOUNTER — Ambulatory Visit: Admission: RE | Admit: 2022-09-27 | Payer: Medicare Other | Source: Home / Self Care | Admitting: Gastroenterology

## 2022-09-27 ENCOUNTER — Encounter: Admission: RE | Payer: Self-pay | Source: Home / Self Care

## 2022-09-27 SURGERY — COLONOSCOPY
Anesthesia: General

## 2023-03-02 ENCOUNTER — Encounter: Payer: Self-pay | Admitting: Gastroenterology

## 2023-04-08 ENCOUNTER — Ambulatory Visit: Admit: 2023-04-08 | Payer: Medicare Other | Admitting: Gastroenterology

## 2023-04-08 SURGERY — COLONOSCOPY WITH PROPOFOL
Anesthesia: General

## 2023-04-18 NOTE — Progress Notes (Signed)
 Chief Complaint:   Chief Complaint  Patient presents with  . Annual Exam    AWV/CPE    Subjective:  Nathan Boone is a 78 y.o. male in today for recheck, Annual Wellness Visit (AWV) and health maintenance issues.   1.  Hypertension - Is on Metoprolol  and terazosin  daily.  Blood pressures have run around 120s/70s generally.  He is walking most days. Denies chest pain, headaches, dizziness, or swelling in his legs.  He notes occasional shortness of breath with exertion in the past year; does not happen every time. 2.  Psoriasis - Has had chronic problem with psoriasis, typically on his legs and arms.  He has used a topical steroid as needed for flares.  Sees a Dermatologist regularly. 3.  Health maintenance issues - He is up-to-date with TdaP (02/19/16), Prevnar (2015?) and pneumovax in 2014.  Had Covid and flu shots this fall.  Last lipids 11/26/21 were well controlled with total cholesterol of 181, HDL 67, LDL 101 and TG 67.  He has fast foods and fried foods infrequently.  Last colonoscopy was in 2009; just got a Cologuard test at home to do.  Last PSA was 1.37 on 11/26/21. He has no nocturia or urgency; stream is okay.  He has a history of smoking and alcohol use but is not using either currently.   4.  Annual Wellness Visit.  Reviewed all aspects of the AWV (forms scanned in).   He has no impairments or limitations on his ADL's.  No signs of depression or gait instability.  He has a health care POA and advance directives.  ROS Per HPI.  He has a history of gout. Is on Allopurinol daily and has had no further gout flares.  Uric acid was 6.2 last check on 11/26/21.  He has erectile dysfunction occasionally; has used Sildenafil  in the past, none recently.  He has a history of allergies, uses over-the-counter medication if needed.  He had left knee replacement lin 20-22 and right knee replacement in 2015.  Has no knee pain but says he can't bend his knees to 90 degrees.  Had left wrist carpal tunnel  surgery on 05/27/21, which cleared up numbness in his hand.   Everything else is negative under all systems reviewed.  Past Medical History:  Diagnosis Date  . Allergic rhinitis   . Anemia   . Anxiety   . Essential hypertension, benign   . History of shingles   . Osteoarthritis   . Psoriasis    Dr. Jakie  . Psoriatic arthritis (CMS/HHS-HCC)   . Renal insufficiency    Past Surgical History:  Procedure Laterality Date  . 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 Knee arthroscopy with partial medial and lateral menisectomy with partial synovectomy Left 08/18/2017   Dr.Menz  . EXTRACTION CATARACT EXTRACAPSULAR W/INSERTION INTRAOCULAR PROSTHESIS Left 06/11/2020   Procedure: Left Lensx, ORA -  EXTRACTION CATARACT EXTRACAPSULAR WITH PHACO WITH INSERTION INTRAOCULAR PROSTHESIS;  Surgeon: Thurman Therisa Cohn, MD;  Location: [facility];  Service: Ophthalmology;  Laterality: Left;  . EXTRACTION CATARACT EXTRACAPSULAR W/INSERTION INTRAOCULAR PROSTHESIS Right 06/25/2020   Procedure: Right Lensx/ORA EXTRACTION CATARACT EXTRACAPSULAR WITH PHACO WITH INSERTION INTRAOCULAR PROSTHESIS;  Surgeon: Thurman Therisa Cohn, MD;  Location: [facility];  Service: Ophthalmology;  Laterality: Right;  . REPLACEMENT TOTAL KNEE Left 11/13/2020   Menz  . Carpal Tunnel Release surgery Right 02/23/2022   Dr. Kathlynn  . [facility] Cell Carcinoma Excision Left Cheek 10/2013    . COLONOSCOPY    .  Hand surgery Right   . KNEE ARTHROSCOPY Right   . knee surgeries     3x   Family History  Problem Relation Name Age of Onset  . High blood pressure (Hypertension) Mother    . Heart failure Mother    . Vision loss Mother    . Kidney disease Father    . Cancer Father    . Coronary Artery Disease (Blocked arteries around heart) Son    . Other Other + FH        rotator cuff problems  . Glaucoma Neg Hx    . Macular degeneration Neg Hx    . Anesthesia problems Neg Hx     Social History   Socioeconomic History   . Marital status: Married  Tobacco Use  . Smoking status: Former    Current packs/day: 0.00    Types: Cigarettes    Quit date: 09/19/1969    Years since quitting: 53.6    Passive exposure: Never  . Smokeless tobacco: Never  Vaping Use  . Vaping status: Never Used  Substance and Sexual Activity  . Alcohol use: Yes    Alcohol/week: 17.0 standard drinks of alcohol    Types: 7 Glasses of wine, 10 Cans of beer per week    Comment: socially  . Drug use: No  . Sexual activity: Yes  Social History Narrative   Exercise:  Does some walking.   Diet:  Red meat once a week.  Fast foods twice a week.  Fried foods rarely.   Pneumovax:2014   Prevnar:2015   Social Drivers of Health   Financial Resource Strain: Patient Declined (04/18/2023)   Overall Financial Resource Strain (CARDIA)   . Difficulty of Paying Living Expenses: Patient declined  Food Insecurity: Patient Declined (04/18/2023)   Hunger Vital Sign   . Worried About Programme Researcher, Broadcasting/film/video in the Last Year: Patient declined   . Ran Out of Food in the Last Year: Patient declined  Transportation Needs: Patient Declined (04/18/2023)   PRAPARE - Transportation   . Lack of Transportation (Medical): Patient declined   . Lack of Transportation (Non-Medical): Patient declined  Housing Stability: Patient Declined (04/18/2023)   Housing Stability Vital Sign   . Unable to Pay for Housing in the Last Year: Patient declined   . Homeless in the Last Year: Patient declined   Outpatient Medications Prior to Visit  Medication Sig Dispense Refill  . allopurinoL (ZYLOPRIM) 100 MG tablet TAKE 1 TABLET BY MOUTH EVERY DAY 90 tablet 1  . calcium carbonate (TUMS) 200 mg calcium (500 mg) chewable tablet Take 1 tablet by mouth as needed    . melatonin 10 mg Tab Take 1 tablet by mouth at bedtime as needed    . metoprolol  succinate (TOPROL -XL) 50 MG XL tablet TAKE 1/2 TABLET (25 MG TOTAL) BY MOUTH ONCE DAILY 45 tablet 3  . terazosin  (HYTRIN ) 5 MG capsule TAKE  1 CAPSULE BY MOUTH AT BEDTIME. 90 capsule 1  . triamcinolone  0.1 % cream Apply topically 2 (two) times daily Apply to affected areas 2 times per day as need (may use for 7-10 days, then take 7 days off, may repeat as needed 30 g 0  . sodium, potassium, and magnesium (SUPREP) oral solution Take 1 Bottle by mouth as directed One kit contains 2 bottles.  Take both bottles at the times instructed by your provider. 354 mL 0   No facility-administered medications prior to visit.   Allergies  Allergen Reactions  . Augmentin [  Amoxicillin-Pot Clavulanate] Swelling  . Oxycodone  Hives  . Penicillins Unknown  . Tetracyclines Rash  . Zithromax [Azithromycin] Unknown    Objective:   Vitals:   04/18/23 1412  BP: (!) 142/76  Pulse: 58  SpO2: 97%  Weight: 93.8 kg (206 lb 12.8 oz)  Height: 172.1 cm (5' 7.75)  PainSc: 0-No pain   Body mass index is 31.68 kg/m.   GENERAL:  Patient is a well-developed, overweight male, alert and oriented, in no acute distress.  Affect is normal.  HEENT:  Normocephalic, atraumatic.  Conjunctiva are non-injected.  Sclera non-icteric. Extraocular motions are intact.  Pupils equal, round, and reactive to light and accommodation.  Tympanic membranes are clear bilaterally.  Oropharynx:  Moist mucus membranes.  No tonsillar enlargement, erythema, exudate, or lesions. NECK:  No adenopathy, thyromegaly, JVD, or carotid bruits.   RESPIRATORY:  Normal inspiratory and expiratory effort.  Lungs are clear to auscultation bilaterally. CARDIOVASCULAR:  Regular rate and rhythm.  S1, S2 without murmur, rub, or gallop.  ABDOMEN:  Soft.  Nontender and nondistended.  Bowel sounds are normal.  No organomegaly.  GU/RECTAL: Deferred. EXTREMITIES:  +2 pulses bilaterally.  No cyanosis, clubbing, or edema.  SKIN:   Full body exam deferred to Dermatology. NEURO:  Cranial nerves II-XII are intact.  Motor strength is 5/5 with normal tone in the upper and lower extremities bilaterally.  Reflexes  are 2+ throughout and symmetric.  Sensation is intact to light touch throughout.    PHQ 2/9 from today's flowsheet  PHQ-2 PHQ-2 Over the last 2 weeks, how often have you been bothered by any of the following problems? Little interest or pleasure in doing things: Not at all Feeling down, depressed, or hopeless: Not at all Patient Health Questionnaire-2 Score: 0  PHQ-9 (if PHQ >=3)    Depression Severity and Treatment Recommendations:  0-4= None  5-9= Mild / Treatment: Support, educate to call if worse; return in one month  10-14= Moderate / Treatment: Support, watchful waiting; Antidepressant or Psychotherapy  15-19= Moderately severe / Treatment: Antidepressant OR Psychotherapy  >= 20 = Major depression, severe / Antidepressant AND Psychotherapy   Goals     . Lose Weight     Pt's goal is to lose weight.  Importance is a 5.  Will follow up at next visit.    . Maintain health/healthy lifestyle       Assessment/Plan:  78 year old male here for follow-up, AWV and health maintenance issues. 1.  Hypertension - seems well controlled.  Check BP at home.  Continue on current doses of Metoprolol  and Terazosin .  Check CBC, met B and U/A.  Encouraged increased physical activity.  Is SOB with exertion doesn't improve, dicussed evaluating lung and heart causes. 2.  Allergic rhinitis - Use over-the-counter Zyrtec and/or Nasacort  as needed for symptomatic relief.   3.  Erectile dysfunction - Can use Sildenafil  as needed.   4.  Psoriasis - Follow up with Dermatology as directed for management. 5.   Gout - stay on Allopurinol daily. Check uric acid, CBC and Met B. 6.  Health maintenance issues -  He is up-to-date with immunizations.  Can check with pharmacy about getting Shingrix, Prevnar 20 and RSV.  Check PSA for prostate cancer screening and lipids for screening.  He will return Cologuard test in the next week or two.  Encouraged healthy lifestyle with increased aerobic activity, low-fat diet,  no smoking, and little to no alcohol.  Follow up with Dermatology and Eye doctor for routine checks.  7.  AWV.  Reviewed all recommended immunizations and screening tests.  8.  Return in 6 months for recheck, sooner as needed for acute concerns.  _________________________________ DAVID M. BRONSTEIN, M.D., Ph.D.

## 2023-06-13 NOTE — Progress Notes (Signed)
 This note has been created using automated tools and reviewed for accuracy by [facility].  Chief Complaint  Patient presents with  . Arm Pain    Left arm pain    Subjective  Nathan Boone is a 78 y.o. male who presents for Arm Pain (Left arm pain) HPI History of Present Illness The patient presents with left arm swelling and a history of shoulder pain from a previous fall.  Left arm swelling began after an incident last Thursday while working. He was pulling on a vehicle window when he felt a sudden change in his arm.  He has a history of shoulder pain from a fall almost a year ago, which was evaluated with an x-ray and MRI showing a rotator cuff tear, biceps tendonitis, and instability. The MRI from last April indicated an unstable biceps tendon. He describes the current situation as a rupture of the biceps tendon, which he experienced similarly on the other arm ten years ago. The previous rupture was painful for two to three weeks but did not significantly affect him.  No use of blood thinners. He had blood work done about three weeks ago, but has not yet discussed the results. He has been slightly anemic for years, with a recent hemoglobin level of 12.7.  Review of Systems  Patient Active Problem List  Diagnosis  . Psoriasis  . Essential hypertension  . Degenerative arthritis of right knee  . Bilateral hand numbness  . Neck pain on left side  . Cataract  . Preoperative evaluation of a medical condition to rule out surgical contraindications (TAR required)  . Patient is Jehovah's Witness  . Erectile dysfunction  . S/P TKR (total knee replacement) using cement, left  . Allergic rhinitis  . Screen for colon cancer  . Localized, primary osteoarthritis of shoulder region    Outpatient Medications Prior to Visit  Medication Sig Dispense Refill  . allopurinoL (ZYLOPRIM) 100 MG tablet TAKE 1 TABLET BY MOUTH EVERY DAY 90 tablet 1  . calcium carbonate (TUMS) 200 mg calcium (500  mg) chewable tablet Take 1 tablet by mouth as needed    . melatonin 10 mg Tab Take 1 tablet by mouth at bedtime as needed    . metoprolol  succinate (TOPROL -XL) 50 MG XL tablet TAKE 1/2 TABLET (25 MG TOTAL) BY MOUTH ONCE DAILY 45 tablet 3  . sodium, potassium, and magnesium (SUPREP) oral solution Take 1 Bottle by mouth as directed One kit contains 2 bottles.  Take both bottles at the times instructed by your provider. 354 mL 0  . terazosin  (HYTRIN ) 5 MG capsule TAKE 1 CAPSULE BY MOUTH AT BEDTIME. 90 capsule 1   No facility-administered medications prior to visit.      Objective  Vitals:   06/10/23 1031  BP: 124/80  Weight: 93.4 kg (206 lb)  Height: 172.7 cm (5' 8)  PainSc:   5  PainLoc: Shoulder   Body mass index is 31.32 kg/m.  Home Vitals:     Physical Exam Physical Exam MUSCULOSKELETAL: Tenderness in the left arm biceps region. Biceps muscle deformity in the left arm.  Constitutional: alert, in NAD, and communicates well  Results LABS HDL: 71.7 mg/dL (May 20, 2023) Hb: 12.7 g/dL (May 20, 2023)  RADIOLOGY MRI shoulder: Rotator cuff tear, biceps tendonitis, instability (April 2024)     Assessment/Plan:   Assessment & Plan Proximal Biceps Tendon Rupture Wadie Ina presents with left arm swelling and a history of shoulder pain from a fall almost a year  ago. Previously diagnosed with a rotator cuff tear, biceps tendonitis, and instability. Examination and MRI confirm a proximal biceps tendon rupture at the shoulder, which occurred last Thursday while working. The condition is primarily cosmetic and does not significantly affect strength. Surgical intervention is not necessary unless for cosmetic reasons, typically sought by bodybuilders. Continue rotator cuff strengthening exercises once comfortable, avoid overexertion, and monitor for normal bruising down the arm. Follow up in two months to assess for any issues and check strength.  Rotator Cuff  Tear Diagnosed via MRI last April. Continue rotator cuff strengthening exercises to prevent further issues and maintain shoulder mobility to avoid frozen shoulder. Maintain shoulder mobility.  General Health Maintenance Recent blood work shows slightly elevated HDL (71.7) and mild anemia (Hb 12.7). No significant abnormalities noted. No immediate action required for slightly elevated HDL and mild anemia. Continue routine health maintenance and follow up as needed.  Follow-up Follow up in two months to assess for any issues and check strength. Diagnoses and all orders for this visit:  Nontraumatic complete tear of left rotator cuff  Tear of left biceps muscle, initial encounter        This visit was coded based on medical decision making (MDM).           Future Appointments     Date/Time Provider Department Center Visit Type   10/17/2023 10:00 AM Bronstein, Alm Hail, MD [facility] CL [facility] OFFICE VISIT       There are no Patient Instructions on file for this visit.  An after visit summary was provided for the patient either in written format (printed) or through MyChart.  This note has been created using automated tools and reviewed for accuracy by [facility].

## 2024-02-17 ENCOUNTER — Inpatient Hospital Stay
Admission: EM | Admit: 2024-02-17 | Discharge: 2024-02-21 | DRG: 308 | Disposition: A | Discharge status: Home / Self Care | Attending: Emergency Medicine | Admitting: Emergency Medicine

## 2024-02-17 ENCOUNTER — Emergency Department

## 2024-02-17 ENCOUNTER — Other Ambulatory Visit: Payer: Self-pay

## 2024-02-17 ENCOUNTER — Inpatient Hospital Stay
Admit: 2024-02-17 | Discharge: 2024-02-17 | Disposition: A | Discharge status: Home / Self Care | Attending: Student | Admitting: Student

## 2024-02-17 DIAGNOSIS — Z87891 Personal history of nicotine dependence: Secondary | ICD-10-CM

## 2024-02-17 DIAGNOSIS — I361 Nonrheumatic tricuspid (valve) insufficiency: Secondary | ICD-10-CM | POA: Diagnosis not present

## 2024-02-17 DIAGNOSIS — I2489 Other forms of acute ischemic heart disease: Secondary | ICD-10-CM | POA: Diagnosis present

## 2024-02-17 DIAGNOSIS — I509 Heart failure, unspecified: Secondary | ICD-10-CM

## 2024-02-17 DIAGNOSIS — I110 Hypertensive heart disease with heart failure: Secondary | ICD-10-CM | POA: Diagnosis present

## 2024-02-17 DIAGNOSIS — Z6830 Body mass index (BMI) 30.0-30.9, adult: Secondary | ICD-10-CM | POA: Diagnosis not present

## 2024-02-17 DIAGNOSIS — Z961 Presence of intraocular lens: Secondary | ICD-10-CM | POA: Diagnosis present

## 2024-02-17 DIAGNOSIS — I4819 Other persistent atrial fibrillation: Principal | ICD-10-CM | POA: Diagnosis present

## 2024-02-17 DIAGNOSIS — J310 Chronic rhinitis: Secondary | ICD-10-CM | POA: Diagnosis present

## 2024-02-17 DIAGNOSIS — Z881 Allergy status to other antibiotic agents status: Secondary | ICD-10-CM | POA: Diagnosis not present

## 2024-02-17 DIAGNOSIS — I5021 Acute systolic (congestive) heart failure: Secondary | ICD-10-CM | POA: Diagnosis present

## 2024-02-17 DIAGNOSIS — L409 Psoriasis, unspecified: Secondary | ICD-10-CM | POA: Diagnosis present

## 2024-02-17 DIAGNOSIS — F419 Anxiety disorder, unspecified: Secondary | ICD-10-CM | POA: Diagnosis present

## 2024-02-17 DIAGNOSIS — Z96652 Presence of left artificial knee joint: Secondary | ICD-10-CM | POA: Diagnosis present

## 2024-02-17 DIAGNOSIS — Z79899 Other long term (current) drug therapy: Secondary | ICD-10-CM

## 2024-02-17 DIAGNOSIS — I4891 Unspecified atrial fibrillation: Principal | ICD-10-CM | POA: Diagnosis present

## 2024-02-17 DIAGNOSIS — Z7901 Long term (current) use of anticoagulants: Secondary | ICD-10-CM

## 2024-02-17 DIAGNOSIS — N179 Acute kidney failure, unspecified: Secondary | ICD-10-CM | POA: Diagnosis present

## 2024-02-17 DIAGNOSIS — M109 Gout, unspecified: Secondary | ICD-10-CM | POA: Diagnosis present

## 2024-02-17 DIAGNOSIS — Z885 Allergy status to narcotic agent status: Secondary | ICD-10-CM | POA: Diagnosis not present

## 2024-02-17 DIAGNOSIS — I340 Nonrheumatic mitral (valve) insufficiency: Secondary | ICD-10-CM | POA: Diagnosis not present

## 2024-02-17 DIAGNOSIS — R7989 Other specified abnormal findings of blood chemistry: Secondary | ICD-10-CM

## 2024-02-17 DIAGNOSIS — Z888 Allergy status to other drugs, medicaments and biological substances status: Secondary | ICD-10-CM | POA: Diagnosis not present

## 2024-02-17 DIAGNOSIS — Z96653 Presence of artificial knee joint, bilateral: Secondary | ICD-10-CM | POA: Diagnosis present

## 2024-02-17 DIAGNOSIS — R0902 Hypoxemia: Secondary | ICD-10-CM | POA: Diagnosis present

## 2024-02-17 DIAGNOSIS — Z85828 Personal history of other malignant neoplasm of skin: Secondary | ICD-10-CM | POA: Diagnosis not present

## 2024-02-17 DIAGNOSIS — E669 Obesity, unspecified: Secondary | ICD-10-CM | POA: Diagnosis present

## 2024-02-17 LAB — CBC
HCT: 33.8 % — ABNORMAL LOW (ref 39.0–52.0)
Hemoglobin: 11.2 g/dL — ABNORMAL LOW (ref 13.0–17.0)
MCH: 32.7 pg (ref 26.0–34.0)
MCHC: 33.1 g/dL (ref 30.0–36.0)
MCV: 98.8 fL (ref 80.0–100.0)
Platelets: 180 K/uL (ref 150–400)
RBC: 3.42 MIL/uL — ABNORMAL LOW (ref 4.22–5.81)
RDW: 12.9 % (ref 11.5–15.5)
WBC: 7.5 K/uL (ref 4.0–10.5)
nRBC: 0 % (ref 0.0–0.2)

## 2024-02-17 LAB — MAGNESIUM: Magnesium: 2.3 mg/dL (ref 1.7–2.4)

## 2024-02-17 LAB — PROTIME-INR
INR: 1.1 (ref 0.8–1.2)
Prothrombin Time: 14.4 s (ref 11.4–15.2)

## 2024-02-17 LAB — TROPONIN T, HIGH SENSITIVITY
Troponin T High Sensitivity: 480 ng/L (ref 0–19)
Troponin T High Sensitivity: 450 ng/L (ref 0–19)

## 2024-02-17 LAB — BASIC METABOLIC PANEL WITH GFR
Anion gap: 15 (ref 5–15)
BUN: 37 mg/dL — ABNORMAL HIGH (ref 8–23)
CO2: 21 mmol/L — ABNORMAL LOW (ref 22–32)
Calcium: 9 mg/dL (ref 8.9–10.3)
Chloride: 103 mmol/L (ref 98–111)
Creatinine, Ser: 1.65 mg/dL — ABNORMAL HIGH (ref 0.61–1.24)
GFR, Estimated: 43 mL/min — ABNORMAL LOW (ref >60)
Glucose, Bld: 116 mg/dL — ABNORMAL HIGH (ref 70–99)
Potassium: 4.3 mmol/L (ref 3.5–5.1)
Sodium: 138 mmol/L (ref 135–145)

## 2024-02-17 LAB — HEPARIN LEVEL (UNFRACTIONATED): Heparin Unfractionated: 0.25 [IU]/mL — ABNORMAL LOW (ref 0.30–0.70)

## 2024-02-17 LAB — GLUCOSE, CAPILLARY: Glucose-Capillary: 108 mg/dL — ABNORMAL HIGH (ref 70–99)

## 2024-02-17 LAB — TSH: TSH: 2.55 u[IU]/mL (ref 0.350–4.500)

## 2024-02-17 LAB — MRSA NEXT GEN BY PCR, NASAL: MRSA by PCR Next Gen: NOT DETECTED

## 2024-02-17 LAB — APTT: aPTT: 29 s (ref 24–36)

## 2024-02-17 LAB — PRO BRAIN NATRIURETIC PEPTIDE: Pro Brain Natriuretic Peptide: 7094 pg/mL — ABNORMAL HIGH (ref <300.0)

## 2024-02-17 MED ORDER — TRAZODONE HCL 50 MG PO TABS
25.0000 mg | ORAL_TABLET | Freq: Every evening | ORAL | Status: DC | PRN
  Administered 2024-02-19 (×2): 25 mg via ORAL
  Filled 2024-02-17 (×2): qty 1

## 2024-02-17 MED ORDER — AMIODARONE HCL IN DEXTROSE 360-4.14 MG/200ML-% IV SOLN
60.0000 mg/h | INTRAVENOUS | Status: AC
  Administered 2024-02-17 (×2): 60 mg/h via INTRAVENOUS
  Filled 2024-02-17: qty 200

## 2024-02-17 MED ORDER — DILTIAZEM HCL 25 MG/5ML IV SOLN
20.0000 mg | Freq: Once | INTRAVENOUS | Status: AC
  Administered 2024-02-17: 20 mg via INTRAVENOUS
  Filled 2024-02-17: qty 5

## 2024-02-17 MED ORDER — CHLORHEXIDINE GLUCONATE CLOTH 2 % EX PADS
6.0000 | MEDICATED_PAD | Freq: Every day | CUTANEOUS | Status: DC
  Administered 2024-02-18 – 2024-02-21 (×4): 6 via TOPICAL

## 2024-02-17 MED ORDER — ACETAMINOPHEN 325 MG PO TABS
650.0000 mg | ORAL_TABLET | Freq: Four times a day (QID) | ORAL | Status: DC | PRN
  Administered 2024-02-17 – 2024-02-21 (×5): 650 mg via ORAL
  Filled 2024-02-17 (×5): qty 2

## 2024-02-17 MED ORDER — ALLOPURINOL 100 MG PO TABS
100.0000 mg | ORAL_TABLET | Freq: Every day | ORAL | Status: DC
  Administered 2024-02-18 – 2024-02-21 (×4): 100 mg via ORAL
  Filled 2024-02-17 (×5): qty 1

## 2024-02-17 MED ORDER — DILTIAZEM HCL-DEXTROSE 125-5 MG/125ML-% IV SOLN (PREMIX)
5.0000 mg/h | INTRAVENOUS | Status: DC
  Filled 2024-02-17: qty 125

## 2024-02-17 MED ORDER — METHOCARBAMOL 500 MG PO TABS
500.0000 mg | ORAL_TABLET | Freq: Four times a day (QID) | ORAL | Status: DC | PRN
  Administered 2024-02-17 – 2024-02-19 (×3): 500 mg via ORAL
  Filled 2024-02-17 (×4): qty 1

## 2024-02-17 MED ORDER — ACETAMINOPHEN 650 MG RE SUPP: 650.0000 mg | Freq: Four times a day (QID) | RECTAL | Status: DC | PRN

## 2024-02-17 MED ORDER — FUROSEMIDE 10 MG/ML IJ SOLN
40.0000 mg | Freq: Two times a day (BID) | INTRAMUSCULAR | Status: DC
  Administered 2024-02-17 – 2024-02-20 (×7): 40 mg via INTRAVENOUS
  Filled 2024-02-17 (×7): qty 4

## 2024-02-17 MED ORDER — HEPARIN (PORCINE) 25000 UT/250ML-% IV SOLN
1600.0000 [IU]/h | INTRAVENOUS | Status: DC
  Administered 2024-02-17: 1250 [IU]/h via INTRAVENOUS
  Administered 2024-02-18 (×2): 1400 [IU]/h via INTRAVENOUS
  Administered 2024-02-19 – 2024-02-20 (×2): 1600 [IU]/h via INTRAVENOUS
  Filled 2024-02-17 (×5): qty 250

## 2024-02-17 MED ORDER — METOPROLOL SUCCINATE ER 25 MG PO TB24
12.5000 mg | ORAL_TABLET | Freq: Every day | ORAL | Status: DC
  Administered 2024-02-18 – 2024-02-20 (×3): 12.5 mg via ORAL
  Filled 2024-02-17 (×3): qty 0.5

## 2024-02-17 MED ORDER — ONDANSETRON HCL 4 MG PO TABS: 4.0000 mg | ORAL_TABLET | Freq: Four times a day (QID) | ORAL | Status: DC | PRN

## 2024-02-17 MED ORDER — MELATONIN 5 MG PO TABS
5.0000 mg | ORAL_TABLET | Freq: Every day | ORAL | Status: DC
  Administered 2024-02-17 – 2024-02-20 (×4): 5 mg via ORAL
  Filled 2024-02-17 (×4): qty 1

## 2024-02-17 MED ORDER — DILTIAZEM LOAD VIA INFUSION
20.0000 mg | Freq: Once | INTRAVENOUS | Status: DC
  Filled 2024-02-17: qty 20

## 2024-02-17 MED ORDER — HEPARIN BOLUS VIA INFUSION
1300.0000 [IU] | Freq: Once | INTRAVENOUS | Status: AC
  Administered 2024-02-17: 1300 [IU] via INTRAVENOUS
  Filled 2024-02-17: qty 1300

## 2024-02-17 MED ORDER — AMIODARONE LOAD VIA INFUSION
150.0000 mg | Freq: Once | INTRAVENOUS | Status: AC
  Administered 2024-02-17: 150 mg via INTRAVENOUS
  Filled 2024-02-17: qty 83.34

## 2024-02-17 MED ORDER — ONDANSETRON HCL 4 MG/2ML IJ SOLN: 4.0000 mg | Freq: Four times a day (QID) | INTRAMUSCULAR | Status: DC | PRN

## 2024-02-17 MED ORDER — CHLORHEXIDINE GLUCONATE CLOTH 2 % EX PADS: 6.0000 | MEDICATED_PAD | Freq: Every day | CUTANEOUS | Status: DC

## 2024-02-17 MED ORDER — ORAL CARE MOUTH RINSE
15.0000 mL | OROMUCOSAL | Status: DC
  Administered 2024-02-17 – 2024-02-21 (×12): 15 mL via OROMUCOSAL

## 2024-02-17 MED ORDER — AMIODARONE HCL IN DEXTROSE 360-4.14 MG/200ML-% IV SOLN
30.0000 mg/h | INTRAVENOUS | Status: AC
  Administered 2024-02-17 – 2024-02-20 (×6): 30 mg/h via INTRAVENOUS
  Filled 2024-02-17 (×5): qty 200

## 2024-02-17 MED ORDER — DILTIAZEM HCL 25 MG/5ML IV SOLN
15.0000 mg | Freq: Once | INTRAVENOUS | Status: AC
  Administered 2024-02-17: 15 mg via INTRAVENOUS
  Filled 2024-02-17: qty 5

## 2024-02-17 MED ORDER — ORAL CARE MOUTH RINSE: 15.0000 mL | OROMUCOSAL | Status: DC | PRN

## 2024-02-17 MED ORDER — FUROSEMIDE 10 MG/ML IJ SOLN
40.0000 mg | Freq: Once | INTRAMUSCULAR | Status: AC
  Administered 2024-02-17: 40 mg via INTRAVENOUS
  Filled 2024-02-17: qty 4

## 2024-02-17 MED ORDER — METOPROLOL SUCCINATE ER 25 MG PO TB24: 25.0000 mg | ORAL_TABLET | Freq: Every day | ORAL | Status: DC

## 2024-02-17 MED ORDER — HEPARIN BOLUS VIA INFUSION
4300.0000 [IU] | Freq: Once | INTRAVENOUS | Status: AC
  Administered 2024-02-17: 4300 [IU] via INTRAVENOUS
  Filled 2024-02-17: qty 4300

## 2024-02-17 MED ORDER — ALBUTEROL SULFATE (2.5 MG/3ML) 0.083% IN NEBU: 2.5000 mg | INHALATION_SOLUTION | RESPIRATORY_TRACT | Status: DC | PRN

## 2024-02-17 NOTE — ED Provider Notes (Signed)
 Nathan Boone Provider Note    Event Date/Time   First MD Initiated Contact with Patient 02/17/24 [phone]     (approximate)   History   Chest Pain and Shortness of Breath   HPI  Nathan Boone is a 78 y.o. male with history of anemia, hypertension, anxiety, presenting with shortness of breath and chest pain for the last week.  States that the chest pain is mild.  Notes a slight cough.  Denies history of any arrhythmias or CHF.  He denies any recent travel or surgeries, no history of malignancies.   On independent review, he was seen by primary care in October has history of hypertension, psoriasis and history of gout on allopurinol.     Physical Exam   Triage Vital Signs: ED Triage Vitals  Encounter Vitals Group     BP 02/17/24 0946 (!) 123/91     Girls Systolic BP Percentile --      Girls Diastolic BP Percentile --      Boys Systolic BP Percentile --      Boys Diastolic BP Percentile --      Pulse Rate 02/17/24 0946 (!) 176     Resp 02/17/24 0946 (!) 21     Temp 02/17/24 0946 98 F (36.7 C)     Temp src --      SpO2 02/17/24 0946 97 %     Weight 02/17/24 0942 202 lb (91.6 kg)     Height 02/17/24 0942 5' 8 (1.727 m)     Head Circumference --      Peak Flow --      Pain Score 02/17/24 0942 0     Pain Loc --      Pain Education --      Exclude from Growth Chart --     Most recent vital signs: Vitals:   02/17/24 1015 02/17/24 1030  BP: (!) 113/94 (!) 123/96  Pulse: (!) 44 (!) 107  Resp: (!) 35 (!) 37  Temp:    SpO2: 97% 98%     General: Awake, no distress.  CV:  Good peripheral perfusion.  Resp:  Normal effort.  No increased work of breathing Abd:  No distention.  Soft nontender Other:  Trace lower extremity edema.  No unilateral calf/tenderness   ED Results / Procedures / Treatments   Labs (all labs ordered are listed, but only abnormal results are displayed) Labs Reviewed  BASIC METABOLIC PANEL WITH GFR - Abnormal; Notable  for the following components:      Result Value   CO2 21 (*)    Glucose, Bld 116 (*)    BUN 37 (*)    Creatinine, Ser 1.65 (*)    GFR, Estimated 43 (*)    All other components within normal limits  CBC - Abnormal; Notable for the following components:   RBC 3.42 (*)    Hemoglobin 11.2 (*)    HCT 33.8 (*)    All other components within normal limits  PRO BRAIN NATRIURETIC PEPTIDE - Abnormal; Notable for the following components:   Pro Brain Natriuretic Peptide 7,094.0 (*)    All other components within normal limits  TROPONIN T, HIGH SENSITIVITY - Abnormal; Notable for the following components:   Troponin T High Sensitivity 480 (*)    All other components within normal limits  APTT  PROTIME-INR  HEPARIN LEVEL (UNFRACTIONATED)  TROPONIN T, HIGH SENSITIVITY     EKG  EKG shows, A-fib with RVR to 160,  normal QRS, normal QTc, no obvious ischemic ST elevation, ST depression to V6, V5, T wave inversion to aVL, this is changed compared to prior   RADIOLOGY On my independent interpretation, chest x-ray shows vascular congestion and pulmonary edema   PROCEDURES:  Critical Care performed: Yes, see critical care procedure note(s)  .Critical Care  Performed by: Waymond Lorelle Cummins, MD Authorized by: Waymond Lorelle Cummins, MD   Critical care provider statement:    Critical care time (minutes):  40   Critical care was necessary to treat or prevent imminent or life-threatening deterioration of the following conditions:  Circulatory failure   Critical care was time spent personally by me on the following activities:  Development of treatment plan with patient or surrogate, discussions with consultants, evaluation of patient's response to treatment, examination of patient, ordering and review of laboratory studies, ordering and review of radiographic studies, ordering and performing treatments and interventions, pulse oximetry, re-evaluation of patient's condition and review of old charts Ultrasound ED  Echo  Date/Time: 02/17/2024 10:14 AM  Performed by: Waymond Lorelle Cummins, MD Authorized by: Waymond Lorelle Cummins, MD   Procedure details:    Indications: dyspnea     Views: parasternal long axis view and IVC view   Findings:    Pericardium: no pericardial effusion     LV Function comment:  Diminished contractility   IVC comment:  Plethoric Impression:    Impression comment:  Fluid intolerant IVC, diminished contractility, no obvious pericardial effusion, RV not overtly dilated. Ultrasound ED Thoracic  Date/Time: 02/17/2024 10:15 AM  Performed by: Waymond Lorelle Cummins, MD Authorized by: Waymond Lorelle Cummins, MD   Procedure details:    Indications: dyspnea     Left lung pleural:  Visualized   Right lung pleural:  Visualized   Images: not archived   Findings:    A-lines noted throughout: identified     B-lines noted throughout: identified   Impression:    Impression comment:  B-lines noted right lateral, no significant B-lines on the left.    MEDICATIONS ORDERED IN ED: Medications  heparin bolus via infusion 4,300 Units (has no administration in time range)  heparin ADULT infusion 100 units/mL (25000 units/250mL) (has no administration in time range)  diltiazem (CARDIZEM) injection 15 mg (15 mg Intravenous Given 02/17/24 1000)  furosemide (LASIX) injection 40 mg (40 mg Intravenous Given 02/17/24 1103)  diltiazem (CARDIZEM) injection 20 mg (20 mg Intravenous Given 02/17/24 1053)     IMPRESSION / MDM / ASSESSMENT AND PLAN / ED COURSE  I reviewed the triage vital signs and the nursing notes.                              Differential diagnosis includes, but is not limited to, new atrial fibrillation, new CHF, viral illness, pneumonia, or arrhythmia, atypical ACS, did also consider PE but patient has no other risk factors for it.  Will get labs, EKG, troponin, chest x-ray.  Bedside ultrasound.  Give him a dose of IV Dilt here.  Patient's presentation is most consistent with acute presentation with  potential threat to life or bodily function.  Independent interpretation of labs and imaging below.  On reassessment patient's heart rate is now in the 110s after second round of IV Dilt.  He was given IV Lasix for new CHF.  Given his new A-fib and new CHF, he needs to be admitted for further management.  His troponin was found to be elevated,  suspect this might be demand given that he has had a week of symptoms, states that he has no chest pain at this time.  Will start on IV heparin for A-fib.  Discussed with patient and family about imaging lab results including incidental findings.  Consulted hospitalist who is agreeable to plan for admission and will evaluate the patient.  He is admitted.   The patient is on the cardiac monitor to evaluate for evidence of arrhythmia and/or significant heart rate changes.   Clinical Course as of 02/17/24 1139  Fri Feb 17, 2024  1011 On reassessment heart rate is now in the 1 teens to 130s after the initial dose of IV Dilt. [TT]  1033 DG Chest Port 1 View IMPRESSION: Enlargement of the cardiopericardial silhouette with vascular congestion and diffuse interstitial opacity suggesting edema.   [TT]  1035 Patient satting 90-91% on room air, placed on 2 L nasal cannula. [TT]  1050 On reassessment patient's heart rate is in the 130s, will give him another dose of IV Dilt. [TT]    Clinical Course User Index [TT] Waymond Lorelle Cummins, MD     FINAL CLINICAL IMPRESSION(S) / ED DIAGNOSES   Final diagnoses:  Atrial fibrillation with RVR (HCC)  Acute on chronic congestive heart failure, unspecified heart failure type (HCC)  Elevated troponin  Hypoxia     Rx / DC Orders   ED Discharge Orders     None        Note:  This document was prepared using Dragon voice recognition software and may include unintentional dictation errors.    Waymond Lorelle Cummins, MD 02/17/24 [phone]

## 2024-02-17 NOTE — Consult Note (Signed)
 [facility] CLINIC CARDIOLOGY CONSULT NOTE       Patient ID: Nathan Boone MRN: [id number] DOB/AGE: 10-30-1945 78 y.o.  Admit date: 02/17/2024 Referring Physician Dr. Katha Gail Primary Physician Glover Lenis, MD  Primary Cardiologist None [facility] PCP) Reason for Consultation New onset AF, new HF  HPI: Nathan Boone is a 78 y.o. male  with a past medical history of hypertension, anemia, anxiety who presented to the ED on 02/17/2024 for SOB, CP. Cardiology was consulted for further evaluation.   Patient with worsening SOB for 1 week. Given persistence of symptoms decided to come in for evaluation. Workup in the ED notable for creatinine 1.65, potassium 4.3, hemoglobin 11.2, WBC 7.5. Troponins 480 > 450, BNP 7094. EKG in the ED AF RVR rate 160 bpm. CXR with pulmonary vascular congestion. Given IV lasix, IV diltiazem in the ED.   At the time of my evaluation this afternoon, patient is resting comfortably in ED stretcher with wife and son at bedside.  We discussed his symptoms in further detail.  He endorses worsening shortness of breath for the last week with associated abdominal fullness and decreased appetite.  Endorses mild chest tightness but this has not been very bothersome for him and has been associated with the shortness of breath.  He states that he does not lay flat at baseline so has not noticed any orthopnea.  States that he did notice earlier this week when push mowing his yard that he was more short of breath than normal.  Denied any exertional chest discomfort with that activity.  He denies any palpitation symptoms despite heart rate being elevated in atrial fibrillation.  Has not noticed any lower extremity edema.  Review of systems complete and found to be negative unless listed above    Past Medical History:  Diagnosis Date   Anemia    Anxiety    Basal cell carcinoma    left cheek and left forearm   History of shingles    Hypertension    Osteoarthritis     Psoriasis    Rhinitis    Shingles     Past Surgical History:  Procedure Laterality Date   basal cell carcinoma removal     left cheek and left arm, left elbow and right arm   CARDIAC CATHETERIZATION     CARPAL TUNNEL RELEASE Right    CARPAL TUNNEL RELEASE Right 02/23/2022   Procedure: CARPAL TUNNEL RELEASE;  Surgeon: Kathlynn Sharper, MD;  Location: [facility];  Service: Orthopedics;  Laterality: Right;   CATARACT EXTRACTION W/ INTRAOCULAR LENS IMPLANT Left 06/11/2020   CATARACT EXTRACTION W/ INTRAOCULAR LENS IMPLANT Right 06/25/2020   COLONOSCOPY     JOINT REPLACEMENT Right 01/17/2014   knee   KNEE ARTHROSCOPY Right    KNEE ARTHROSCOPY WITH MEDIAL MENISECTOMY Left 08/18/2017   Procedure: KNEE ARTHROSCOPY WITH PARTIAL MEDIAL AND LATERAL MENISECTOMY WITH PARTIAL SYNOVECTOMY;  Surgeon: Kathlynn Sharper, MD;  Location: [facility];  Service: Orthopedics;  Laterality: Left;   KNEE CLOSED REDUCTION Left 12/15/2020   Procedure: CLOSED MANIPULATION KNEE;  Surgeon: Kathlynn Sharper, MD;  Location: [facility];  Service: Orthopedics;  Laterality: Left;   RETINAL LASER PROCEDURE Right    TOTAL KNEE ARTHROPLASTY Left 11/13/2020   Procedure: TOTAL KNEE ARTHROPLASTY;  Surgeon: Kathlynn Sharper, MD;  Location: [facility];  Service: Orthopedics;  Laterality: Left;    (Not in a hospital admission)  Social History   Socioeconomic History   Marital status: Married    Spouse name: Ronal  Number of children: 1   Years of education: Not on file   Highest education level: Not on file  Occupational History   Not on file  Tobacco Use   Smoking status: Former    Current packs/day: 0.00    Types: Cigarettes    Quit date: 1970    Years since quitting: 55.9   Smokeless tobacco: Never  Vaping Use   Vaping status: Never Used  Substance and Sexual Activity   Alcohol use: Yes    Alcohol/week: 11.0 standard drinks of alcohol    Types: 7 Glasses of wine, 4 Cans of beer per week    Comment: Occasional   Drug  use: Never   Sexual activity: Not on file  Other Topics Concern   Not on file  Social History Narrative   Not on file   Social Drivers of Health   Financial Resource Strain: Low Risk  (06/10/2023)   Received from [facility]   Overall Financial Resource Strain (CARDIA)    Difficulty of Paying Living Expenses: Not hard at all  Food Insecurity: No Food Insecurity (06/10/2023)   Received from [facility]   Hunger Vital Sign    Within the past 12 months, you worried that your food would run out before you got the money to buy more.: Never true    Within the past 12 months, the food you bought just didn't last and you didn't have money to get more.: Never true  Transportation Needs: No Transportation Needs (06/10/2023)   Received from [facility] - Transportation    In the past 12 months, has lack of transportation kept you from medical appointments or from getting medications?: No    Lack of Transportation (Non-Medical): No  Physical Activity: Not on file  Stress: Not on file  Social Connections: Not on file  Intimate Partner Violence: Not on file    History reviewed. No pertinent family history.   Vitals:   02/17/24 9057 02/17/24 0946 02/17/24 1015 02/17/24 1030  BP:  (!) 123/91 (!) 113/94 (!) 123/96  Pulse:  (!) 176 (!) 44 (!) 107  Resp:  (!) 21 (!) 35 (!) 37  Temp:  98 F (36.7 C)    SpO2:  97% 97% 98%  Weight: 91.6 kg     Height: 5' 8 (1.727 m)       PHYSICAL EXAM General: Well-appearing elderly male, well nourished, in no acute distress. HEENT: Normocephalic and atraumatic. Neck: No JVD.  Lungs: Normal respiratory effort on 2L [place].  Bibasilar crackles. Heart: Irregularly irregular, elevated rate. Normal S1 and S2 without gallops or murmurs.  Abdomen: Non-distended appearing.  Msk: Normal strength and tone for age. Extremities: Warm and well perfused. No clubbing, cyanosis.  Trace edema.  Neuro: Alert and  oriented X 3. Psych: Answers questions appropriately.   Labs: Basic Metabolic Panel: Recent Labs    02/17/24 0957  NA 138  K 4.3  CL 103  CO2 21*  GLUCOSE 116*  BUN 37*  CREATININE 1.65*  CALCIUM 9.0   Liver Function Tests: No results for input(s): AST, ALT, ALKPHOS, BILITOT, PROT, ALBUMIN in the last 72 hours. No results for input(s): LIPASE, AMYLASE in the last 72 hours. CBC: Recent Labs    02/17/24 0957  WBC 7.5  HGB 11.2*  HCT 33.8*  MCV 98.8  PLT 180   Cardiac Enzymes: No results for input(s): CKTOTAL, CKMB, CKMBINDEX, TROPONINIHS in the last 72 hours. BNP:  No results for input(s): BNP in the last 72 hours. D-Dimer: No results for input(s): DDIMER in the last 72 hours. Hemoglobin A1C: No results for input(s): HGBA1C in the last 72 hours. Fasting Lipid Panel: No results for input(s): CHOL, HDL, LDLCALC, TRIG, CHOLHDL, LDLDIRECT in the last 72 hours. Thyroid Function Tests: No results for input(s): TSH, T4TOTAL, T3FREE, THYROIDAB in the last 72 hours.  Invalid input(s): FREET3 Anemia Panel: No results for input(s): VITAMINB12, FOLATE, FERRITIN, TIBC, IRON, RETICCTPCT in the last 72 hours.   Radiology: [facility] Chest Port 1 View Result Date: 02/17/2024 CLINICAL DATA:  Chest pain EXAM: PORTABLE CHEST 1 VIEW COMPARISON:  None Available. FINDINGS: The cardio pericardial silhouette is enlarged. Vascular congestion with superimposed diffuse interstitial opacity suggests edema. No acute bony abnormality. Telemetry leads overlie the chest. IMPRESSION: Enlargement of the cardiopericardial silhouette with vascular congestion and diffuse interstitial opacity suggesting edema. Electronically Signed   By: Camellia Candle M.D.   On: 02/17/2024 10:14    ECHO ordered  TELEMETRY (personally reviewed): Atrial fibrillation rate 110s  EKG (personally reviewed): AF RVR rate 160 bpm  Data reviewed by me 02/17/2024: last  24h vitals tele labs imaging I/O ED provider note, admission H&P  Principal Problem:   Atrial fibrillation with rapid ventricular response (HCC)    ASSESSMENT AND PLAN:  Nathan Boone is a 78 y.o. male  with a past medical history of hypertension, anemia, anxiety who presented to the ED on 02/17/2024 for SOB, CP. Cardiology was consulted for further evaluation.   # Atrial fibrillation RVR # New onset atrial fibrillation # New onset heart failure, unclear if systolic vs diastolic # Demand ischemia # Hypertension Patient with worsening shortness of breath, abdominal distention over the last week.  Associated mild chest tightness.  BNP significantly elevated at 7094.  Troponins mildly elevated and flat at 480 > 450.  EKG with atrial fibrillation RVR rate 160 bpm. -Echo ordered. Further recommendations pending these results.  -Will continue IV lasix at 40 mg BID. -Decrease home metoprolol  dose to 12.5 mg daily given acute heart failure exacerbation. -Will start IV amiodarone bolus and infusion in place of diltiazem.  He would likely require higher dose of diltiazem and I am unsure that his BP would tolerate this. -Suspect mild and flat troponin elevation most consistent with demand/supply mismatch and not ACS in the setting of AF RVR and acute heart failure.   This patient's plan of care was discussed and created with Dr. Ammon and he is in agreement.  Signed: Danita Bloch, PA-C  02/17/2024, 12:30 PM [facility] Cardiology

## 2024-02-17 NOTE — Consult Note (Signed)
 Pharmacy Consult Note - Anticoagulation  Pharmacy Consult for Heparin drip Indication: atrial fibrillation Allergies  Allergen Reactions   Augmentin [Amoxicillin-Pot Clavulanate] Swelling   Macrolides And Ketolides     Unknown reaction   Oxycodone  Hives   Tetracyclines & Related Rash    Sunburn rash    PATIENT MEASUREMENTS: Height: 5' 8 (172.7 cm) Weight: 90.9 kg (200 lb 6.4 oz) IBW/kg (Calculated) : 68.4 HEPARIN DW (KG): 87.3  VITAL SIGNS: Temp: 98.6 F (37 C) (11/14 1947) Temp Source: Oral (11/14 1947) BP: 112/90 (11/14 2115) Pulse Rate: 101 (11/14 2115)  Recent Labs    02/17/24 0957 02/17/24 1134 02/17/24 2059  HGB 11.2*  --   --   HCT 33.8*  --   --   PLT 180  --   --   APTT  --  29  --   LABPROT  --  14.4  --   INR  --  1.1  --   HEPARINUNFRC  --   --  0.25*  CREATININE 1.65*  --   --     Estimated Creatinine Clearance: 41 mL/min (A) (by C-G formula based on SCr of 1.65 mg/dL (H)).  PAST MEDICAL HISTORY: Past Medical History:  Diagnosis Date   Anemia    Anxiety    Basal cell carcinoma    left cheek and left forearm   History of shingles    Hypertension    Osteoarthritis    Psoriasis    Rhinitis    Shingles     Medications:  Medications Prior to Admission  Medication Sig Dispense Refill Last Dose/Taking   allopurinol (ZYLOPRIM) 100 MG tablet Take 100 mg by mouth daily.   02/17/2024   Melatonin 10 MG TABS Take 10 mg by mouth at bedtime as needed (sleep).   02/16/2024   metoprolol  succinate (TOPROL -XL) 50 MG 24 hr tablet Take 25 mg by mouth daily.   02/17/2024   terazosin  (HYTRIN ) 5 MG capsule Take 5 mg by mouth at bedtime.   02/16/2024   Scheduled:   allopurinol  100 mg Oral Daily   [START ON 02/18/2024] Chlorhexidine  Gluconate Cloth  6 each Topical Daily   furosemide  40 mg Intravenous BID   melatonin  5 mg Oral QHS   [START ON 02/18/2024] metoprolol  succinate  12.5 mg Oral Daily   mouth rinse  15 mL Mouth Rinse 4 times per day    Infusions:   amiodarone 30 mg/hr (02/17/24 2100)   heparin 1,250 Units/hr (02/17/24 2100)   PRN:   ASSESSMENT: 78 y.o. male with PMH HTN and anemia is presenting with SOB , chest pain, elevated troponin, and new onset A.fib in RVR. Patient is not on chronic anticoagulation per chart review. Pharmacy has been consulted to initiate and manage heparin intravenous infusion.   Goal(s) of therapy: Heparin level 0.3 - 0.7 units/mL aPTT 66 - 102 seconds Monitor platelets by anticoagulation protocol: Yes   Baseline anticoagulation labs: Recent Labs    02/17/24 0957 02/17/24 1134  APTT  --  29  INR  --  1.1  HGB 11.2*  --   PLT 180  --     Date Time HL Rate/Comment  11/14 2059 0.25 Subtherapeutic @ 1250 units/hr  PLAN:  HL subtherapeuitc Give 1300 units bolus x1; then increase heparin infusion to 1400 units/hour.  Check heparin level in 8 hours after start of infusion, then daily once at least two levels are consecutively therapeutic.  Monitor CBC daily while on heparin infusion.  Annabella LOISE Banks, PharmD Clinical Pharmacist 02/17/2024 10:01 PM

## 2024-02-17 NOTE — ED Triage Notes (Addendum)
 Pt comes with sob and cp that started in the last month. Pt states getting tired while walking and mowing his yard which is abnormal. Pt states it getting worse. Pt states no hx of afib or chf.   HR on EKG reading 190s. Pt states some tightness in chest

## 2024-02-17 NOTE — H&P (Signed)
 History and Physical  Nathan Boone FMW:[id number] DOB: 1945-08-01 DOA: 02/17/2024  PCP: Glover Lenis, MD   Chief Complaint: Shortness of breath  HPI: Nathan Boone is a 78 y.o. male with medical history significant for hypertension, osteoarthritis, psoriasis being admitted to the hospital with progressive dyspnea on exertion found to have new onset rapid A-fib, elevated troponin, and evidence of heart failure.  History is provided by the patient as well as his wife was at the bedside, they state that gradual over the last several weeks he has become progressively more short of breath with exertion.  Denies any chest pain, fevers, cough, nausea, vomiting, extremity edema or significant weight gain.  Review of Systems: Please see HPI for pertinent positives and negatives. A complete 10 system review of systems are otherwise negative.  Past Medical History:  Diagnosis Date   Anemia    Anxiety    Basal cell carcinoma    left cheek and left forearm   History of shingles    Hypertension    Osteoarthritis    Psoriasis    Rhinitis    Shingles    Past Surgical History:  Procedure Laterality Date   basal cell carcinoma removal     left cheek and left arm, left elbow and right arm   CARDIAC CATHETERIZATION     CARPAL TUNNEL RELEASE Right    CARPAL TUNNEL RELEASE Right 02/23/2022   Procedure: CARPAL TUNNEL RELEASE;  Surgeon: Kathlynn Sharper, MD;  Location: [facility];  Service: Orthopedics;  Laterality: Right;   CATARACT EXTRACTION W/ INTRAOCULAR LENS IMPLANT Left 06/11/2020   CATARACT EXTRACTION W/ INTRAOCULAR LENS IMPLANT Right 06/25/2020   COLONOSCOPY     JOINT REPLACEMENT Right 01/17/2014   knee   KNEE ARTHROSCOPY Right    KNEE ARTHROSCOPY WITH MEDIAL MENISECTOMY Left 08/18/2017   Procedure: KNEE ARTHROSCOPY WITH PARTIAL MEDIAL AND LATERAL MENISECTOMY WITH PARTIAL SYNOVECTOMY;  Surgeon: Kathlynn Sharper, MD;  Location: [facility];  Service: Orthopedics;  Laterality: Left;    KNEE CLOSED REDUCTION Left 12/15/2020   Procedure: CLOSED MANIPULATION KNEE;  Surgeon: Kathlynn Sharper, MD;  Location: [facility];  Service: Orthopedics;  Laterality: Left;   RETINAL LASER PROCEDURE Right    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 His smoking use included cigarettes. He has never used smokeless tobacco. He reports current alcohol use of about 11.0 standard drinks of alcohol per week. He reports that he does not use drugs.  Allergies  Allergen Reactions   Augmentin [Amoxicillin-Pot Clavulanate] Swelling   Macrolides And Ketolides     Unknown reaction   Oxycodone  Hives   Tetracyclines & Related Rash    Sunburn rash    History reviewed. No pertinent family history.   Prior to Admission medications   Medication Sig Start Date End Date Taking? Authorizing Provider  allopurinol (ZYLOPRIM) 100 MG tablet Take 100 mg by mouth daily.    [provider]  calcium carbonate (TUMS - DOSED IN MG ELEMENTAL CALCIUM) 500 MG chewable tablet Chew 1 tablet by mouth daily as needed for indigestion or heartburn.    [provider]  docusate sodium  (COLACE) 100 MG capsule Take 1 capsule (100 mg total) by mouth 2 (two) times daily. Patient not taking: Reported on 02/16/2022 11/14/20   Charlene Debby BROCKS, PA-C  HYDROcodone -acetaminophen  [facility])  7.5-325 MG tablet Take 1 tablet by mouth every 6 (six) hours as needed for moderate pain.    [provider]  Melatonin 10 MG TABS Take 10 mg by mouth at bedtime as needed (sleep).    [provider]  methocarbamol  (ROBAXIN ) 500 MG tablet Take 1 tablet (500 mg total) by mouth every 6 (six) hours as needed for muscle spasms. Patient not taking: Reported on 02/16/2022 11/14/20   Charlene Debby BROCKS, PA-C  metoprolol  succinate (TOPROL -XL)  50 MG 24 hr tablet Take 25 mg by mouth daily. 12/09/16   [provider]  mometasone (NASONEX) 50 MCG/ACT nasal spray Place 2 sprays into the nose daily as needed.    [provider]  Naphazoline-Glycerin (REDNESS RELIEF OP) Place 1 drop into both eyes daily as needed (redness).    [provider]  sildenafil  (REVATIO ) 20 MG tablet Take 1 tablet (20 mg total) by mouth daily as needed (ED). Patient taking differently: Take 60-80 mg by mouth daily as needed (ED). 11/14/20   Charlene Debby BROCKS, PA-C  terazosin  (HYTRIN ) 10 MG capsule Take 10 mg by mouth daily. 06/21/17   [provider]  triamcinolone  cream (KENALOG ) 0.1 % Apply 1 application topically daily as needed (psoriasis). 01/10/19   [provider]    Physical Exam: BP (!) 123/96   Pulse (!) 107   Temp 98 F (36.7 C)   Resp (!) 37   Ht 5' 8 (1.727 m)   Wt 91.6 kg   SpO2 98%   BMI 30.71 kg/m  General:  Alert, oriented, calm, in no acute distress, wife is at the bedside.  Generally the patient appears moderately volume overloaded on exam. Cardiovascular: Tachycardic and irregular, no murmurs or rubs, no peripheral edema  Respiratory: clear to auscultation bilaterally, but with obvious crackles in the lower lung zones up to the mid lung field.  No wheezing, rhonchi, or evidence of respiratory distress.  Speaking in full sentences. Abdomen: soft, nontender, slightly distended, normal bowel tones heard  Skin: dry, no rashes  Musculoskeletal: no joint effusions, normal range of motion  Psychiatric: appropriate affect, normal speech  Neurologic: extraocular muscles intact, clear speech, moving all extremities with intact sensorium         Labs on Admission:  Basic Metabolic Panel: Recent Labs  Lab 02/17/24 0957  NA 138  K 4.3  CL 103  CO2 21*  GLUCOSE 116*  BUN 37*  CREATININE 1.65*  CALCIUM 9.0   Liver Function Tests: No results for input(s): AST, ALT, ALKPHOS, BILITOT,  PROT, ALBUMIN in the last 168 hours. No results for input(s): LIPASE, AMYLASE in the last 168 hours. No results for input(s): AMMONIA in the last 168 hours. CBC: Recent Labs  Lab 02/17/24 0957  WBC 7.5  HGB 11.2*  HCT 33.8*  MCV 98.8  PLT 180   Cardiac Enzymes: No results for input(s): CKTOTAL, CKMB, CKMBINDEX, TROPONINI in the last 168 hours. BNP (last 3 results) No results for input(s): BNP in the last 8760 hours.  ProBNP (last 3 results) Recent Labs    02/17/24 0957  PROBNP 7,094.0*    CBG: No results for input(s): GLUCAP in the last 168 hours.  Radiological Exams on Admission: DG Chest Port 1 View Result Date: 02/17/2024 CLINICAL DATA:  Chest pain EXAM: PORTABLE CHEST 1 VIEW COMPARISON:  None Available. FINDINGS: The cardio pericardial silhouette is enlarged. Vascular congestion with superimposed diffuse interstitial opacity suggests edema. No acute bony abnormality. Telemetry leads overlie the chest.  IMPRESSION: Enlargement of the cardiopericardial silhouette with vascular congestion and diffuse interstitial opacity suggesting edema. Electronically Signed   By: Camellia Candle M.D.   On: 02/17/2024 10:14   Assessment/Plan Nathan Boone is a 78 y.o. male with medical history significant for hypertension, osteoarthritis, psoriasis being admitted to the hospital with progressive dyspnea on exertion found to have new onset rapid A-fib, elevated troponin, and evidence of heart failure.   Atrial fibrillation with RVR-this is new onset, patient initially responded well to 2 rounds of IV Cardizem push but heart rate is now rising intermittently into the 140s. -Inpatient admission to stepdown -Continuous cardiac monitoring -Start IV Cardizem bolus and drip per protocol -Continue home beta-blocker -Check 2D echo -Check TSH  Elevated proBNP, suspected CHF-May be due to atrial stretch from atrial fibrillation, but he also has other features of heart failure  including pulmonary crackles and volume overload -Follow-up 2D echo as above -Given IV Lasix 40 mg in the emergency department, further doses per clinical response -Given suspected heart failure, elevated troponin, and new onset rapid A-fib, inpatient cardiology consultation was requested  Elevated troponin-most likely due to troponin leak from tachycardia.  Will trend.  On heparin as above.  Hypertension-continue home beta-blocker  Gout-allopurinol  Acute kidney injury-patient with baseline normal renal function, likely due to poor forward flow from volume overload -Avoid nephrotoxins as able -Trend renal function daily    Code Status: Full Code  Consults called: Cardiology  Admission status: The appropriate patient status for this patient is INPATIENT. Inpatient status is judged to be reasonable and necessary in order to provide the required intensity of service to ensure the patient's safety. The patient's presenting symptoms, physical exam findings, and initial radiographic and laboratory data in the context of their chronic comorbidities is felt to place them at high risk for further clinical deterioration. Furthermore, it is not anticipated that the patient will be medically stable for discharge from the hospital within 2 midnights of admission.    I certify that at the point of admission it is my clinical judgment that the patient will require inpatient hospital care spanning beyond 2 midnights from the point of admission due to high intensity of service, high risk for further deterioration and high frequency of surveillance required  Time spent: 55 minutes  Zaeem Kandel CHRISTELLA Gail MD [facility] Pager [phone]  If 7PM-7AM, please contact night-coverage [email or web address] Password [facility]  02/17/2024, 12:29 PM

## 2024-02-17 NOTE — Plan of Care (Signed)

## 2024-02-17 NOTE — ED Notes (Signed)
 Charge Jon called for room. RN Rosina called and informed pt HR and that he is on the way to the room.

## 2024-02-17 NOTE — ED Notes (Signed)
 Called CCMD to add pt to monitoring.

## 2024-02-17 NOTE — ED Notes (Signed)
 Pt placed on monitor by EDT Noelle and pt placed on pads with code cart at bedside by this RN. Harlene RN to bedside, Naomi Slough and MD [place].

## 2024-02-17 NOTE — Progress Notes (Signed)
 Echocardiogram 2D Echocardiogram has been performed.  Thea Norlander 02/17/2024, 2:05 PM

## 2024-02-17 NOTE — Consult Note (Signed)
 Pharmacy Consult Note - Anticoagulation  Pharmacy Consult for Heparin drip Indication: atrial fibrillation Allergies  Allergen Reactions   Augmentin [Amoxicillin-Pot Clavulanate] Swelling   Macrolides And Ketolides     Unknown reaction   Oxycodone  Hives   Tetracyclines & Related Rash    Sunburn rash    PATIENT MEASUREMENTS: Height: 5' 8 (172.7 cm) Weight: 91.6 kg (202 lb) IBW/kg (Calculated) : 68.4 HEPARIN DW (KG): 87.3  VITAL SIGNS: Temp: 98 F (36.7 C) (11/14 0946) BP: 123/96 (11/14 1030) Pulse Rate: 107 (11/14 1030)  Recent Labs    02/17/24 0957  HGB 11.2*  HCT 33.8*  PLT 180  CREATININE 1.65*    Estimated Creatinine Clearance: 41.2 mL/min (A) (by C-G formula based on SCr of 1.65 mg/dL (H)).  PAST MEDICAL HISTORY: Past Medical History:  Diagnosis Date   Anemia    Anxiety    Basal cell carcinoma    left cheek and left forearm   History of shingles    Hypertension    Osteoarthritis    Psoriasis    Rhinitis    Shingles     Medications:  (Not in a hospital admission)  Scheduled:   heparin  4,300 Units Intravenous Once   Infusions:   heparin     PRN:   ASSESSMENT: 78 y.o. male with PMH HTN and anemia is presenting with SOB , chest pain, elevated troponin, and new onset A.fib in RVR. Patient is not on chronic anticoagulation per chart review. Pharmacy has been consulted to initiate and manage heparin intravenous infusion.   Goal(s) of therapy: Heparin level 0.3 - 0.7 units/mL aPTT 66 - 102 seconds Monitor platelets by anticoagulation protocol: Yes   Baseline anticoagulation labs: Recent Labs    02/17/24 0957  HGB 11.2*  PLT 180     PLAN:  Give 4300 units bolus x1; then start heparin infusion at 1250 units/hour.  Check heparin level in 8 hours after start of infusion, then daily once at least two levels are consecutively therapeutic.  Monitor CBC daily while on heparin infusion.   Annabella LOISE Banks, PharmD Clinical  Pharmacist 02/17/2024 11:27 AM

## 2024-02-18 DIAGNOSIS — I4891 Unspecified atrial fibrillation: Secondary | ICD-10-CM | POA: Diagnosis not present

## 2024-02-18 LAB — ECHOCARDIOGRAM COMPLETE
AR max vel: 2.04 cm2
AV Peak grad: 5.3 mmHg
Ao pk vel: 1.15 m/s
Area-P 1/2: 4.8 cm2
Height: 68 in
MV M vel: 4.24 m/s
MV Peak grad: 71.7 mmHg
S' Lateral: 3.2 cm
Weight: 3206.37 [oz_av]

## 2024-02-18 LAB — HEPARIN LEVEL (UNFRACTIONATED)
Heparin Unfractionated: 0.38 [IU]/mL (ref 0.30–0.70)
Heparin Unfractionated: 0.36 [IU]/mL (ref 0.30–0.70)

## 2024-02-18 LAB — BASIC METABOLIC PANEL WITH GFR
Anion gap: 14 (ref 5–15)
BUN: 41 mg/dL — ABNORMAL HIGH (ref 8–23)
CO2: 22 mmol/L (ref 22–32)
Calcium: 8.9 mg/dL (ref 8.9–10.3)
Chloride: 102 mmol/L (ref 98–111)
Creatinine, Ser: 1.89 mg/dL — ABNORMAL HIGH (ref 0.61–1.24)
GFR, Estimated: 36 mL/min — ABNORMAL LOW (ref >60)
Glucose, Bld: 117 mg/dL — ABNORMAL HIGH (ref 70–99)
Potassium: 4.4 mmol/L (ref 3.5–5.1)
Sodium: 138 mmol/L (ref 135–145)

## 2024-02-18 LAB — CBC
HCT: 30.7 % — ABNORMAL LOW (ref 39.0–52.0)
Hemoglobin: 10 g/dL — ABNORMAL LOW (ref 13.0–17.0)
MCH: 32.2 pg (ref 26.0–34.0)
MCHC: 32.6 g/dL (ref 30.0–36.0)
MCV: 98.7 fL (ref 80.0–100.0)
Platelets: 177 K/uL (ref 150–400)
RBC: 3.11 MIL/uL — ABNORMAL LOW (ref 4.22–5.81)
RDW: 12.8 % (ref 11.5–15.5)
WBC: 6.1 K/uL (ref 4.0–10.5)
nRBC: 0 % (ref 0.0–0.2)

## 2024-02-18 MED ORDER — LOSARTAN POTASSIUM 25 MG PO TABS
25.0000 mg | ORAL_TABLET | Freq: Every day | ORAL | Status: DC
  Administered 2024-02-18 – 2024-02-21 (×4): 25 mg via ORAL
  Filled 2024-02-18 (×4): qty 1

## 2024-02-18 NOTE — Plan of Care (Signed)

## 2024-02-18 NOTE — Progress Notes (Signed)
 Patient ID: RAYQUON USELMAN, male   DOB: [birth year], 78 y.o.   MRN: [id number] [facility]    SUBJECTIVE: Patient states he feels somewhat better less shortness of breath less palpitation tachycardia no chest pain not requiring oxygen he is able to walk in the room without difficulty denies any previous cardiac history   Vitals:   02/18/24 0500 02/18/24 0600 02/18/24 0630 02/18/24 0700  BP: 100/81 (!) 120/93  115/79  Pulse:  83 99   Resp: (!) 22 (!) 27 (!) 27 19  Temp:      TempSrc:      SpO2: 94% 90% 93%   Weight:      Height:         Intake/Output Summary (Last 24 hours) at 02/18/2024 0916 Last data filed at 02/18/2024 0600 Gross per 24 hour  Intake 653.02 ml  Output 875 ml  Net -221.98 ml      PHYSICAL EXAM  General: Well developed, well nourished, in no acute distress HEENT:  Normocephalic and atramatic Neck:  No JVD.  Lungs: Clear bilaterally to auscultation and percussion. Heart: Irregular irregular. Normal S1 and S2 without gallops or murmurs.  Abdomen: Bowel sounds are positive, abdomen soft and non-tender  Msk:  Back normal, normal gait. Normal strength and tone for age. Extremities: No clubbing, cyanosis or edema.   Neuro: Alert and oriented X 3. Psych:  Good affect, responds appropriately   LABS: Basic Metabolic Panel: Recent Labs    02/17/24 0956 02/17/24 0957 02/18/24 0238  NA  --  138 138  K  --  4.3 4.4  CL  --  103 102  CO2  --  21* 22  GLUCOSE  --  116* 117*  BUN  --  37* 41*  CREATININE  --  1.65* 1.89*  CALCIUM  --  9.0 8.9  MG 2.3  --   --    Liver Function Tests: No results for input(s): AST, ALT, ALKPHOS, BILITOT, PROT, ALBUMIN in the last 72 hours. No results for input(s): LIPASE, AMYLASE in the last 72 hours. CBC: Recent Labs    02/17/24 0957 02/18/24 0238  WBC 7.5 6.1  HGB 11.2* 10.0*  HCT 33.8* 30.7*  MCV 98.8 98.7  PLT 180 177   Cardiac Enzymes: No results for input(s): CKTOTAL, CKMB,  CKMBINDEX, TROPONINI in the last 72 hours. BNP: Invalid input(s): POCBNP D-Dimer: No results for input(s): DDIMER in the last 72 hours. Hemoglobin A1C: No results for input(s): HGBA1C in the last 72 hours. Fasting Lipid Panel: No results for input(s): CHOL, HDL, LDLCALC, TRIG, CHOLHDL, LDLDIRECT in the last 72 hours. Thyroid Function Tests: Recent Labs    02/17/24 1134  TSH 2.550   Anemia Panel: No results for input(s): VITAMINB12, FOLATE, FERRITIN, TIBC, IRON, RETICCTPCT in the last 72 hours.  DG Chest Port 1 View Result Date: 02/17/2024 CLINICAL DATA:  Chest pain EXAM: PORTABLE CHEST 1 VIEW COMPARISON:  None Available. FINDINGS: The cardio pericardial silhouette is enlarged. Vascular congestion with superimposed diffuse interstitial opacity suggests edema. No acute bony abnormality. Telemetry leads overlie the chest. IMPRESSION: Enlargement of the cardiopericardial silhouette with vascular congestion and diffuse interstitial opacity suggesting edema. Electronically Signed   By: Camellia Candle M.D.   On: 02/17/2024 10:14     Echo severely reduced left ventricular function EF of 20 to 25%  TELEMETRY: Atrial fibrillation rapid ventricular response rate of 120 nonspecific ST-T wave changes:  ASSESSMENT AND PLAN:  Principal Problem:   Atrial fibrillation with rapid  ventricular response (HCC) Shortness of breath Tachycardia Palpitations Hypertension Elevated BNP Acute systolic congestive heart failure  Plan Agree with telemetry continue diuretics supplemental oxygen Continue rate control with amiodarone beta-blocker consider adding magnesium Troponin elevation probably non-MI myocardial injury unknown coronary disease Severely reduced left ventricular function EF around 20 to 25% globally recommend GDMT for heart failure cardiomyopathy therapy Referred patient to heart failure therapy as an outpatient Refer the patient to EP for atrial  fibrillation management in the face of heart failure With recommend TEE cardioversion prior to discharge Long-term anticoagulation with Eliquis rate control rhythm control with amiodarone metoprolol  Referred patient to cardiac rehab   Cara JONETTA Lovelace, MD 02/18/2024 9:16 AM

## 2024-02-18 NOTE — Consult Note (Signed)
 Pharmacy Consult Note - Anticoagulation  Pharmacy Consult for Heparin drip Indication: atrial fibrillation Allergies  Allergen Reactions   Augmentin [Amoxicillin-Pot Clavulanate] Swelling   Macrolides And Ketolides     Unknown reaction   Oxycodone  Hives   Tetracyclines & Related Rash    Sunburn rash    PATIENT MEASUREMENTS: Height: 5' 8 (172.7 cm) Weight: 90.9 kg (200 lb 6.4 oz) IBW/kg (Calculated) : 68.4 HEPARIN DW (KG): 87.3  VITAL SIGNS: Temp: 98 F (36.7 C) (11/15 0400) Temp Source: Oral (11/15 0200) BP: 104/79 (11/15 0400) Pulse Rate: 97 (11/15 0427)  Recent Labs    02/17/24 1134 02/17/24 2059 02/18/24 0238  HGB  --   --  10.0*  HCT  --   --  30.7*  PLT  --   --  177  APTT 29  --   --   LABPROT 14.4  --   --   INR 1.1  --   --   HEPARINUNFRC  --    < > 0.38  CREATININE  --   --  1.89*   < > = values in this interval not displayed.    Estimated Creatinine Clearance: 35.8 mL/min (A) (by C-G formula based on SCr of 1.89 mg/dL (H)).  PAST MEDICAL HISTORY: Past Medical History:  Diagnosis Date   Anemia    Anxiety    Basal cell carcinoma    left cheek and left forearm   History of shingles    Hypertension    Osteoarthritis    Psoriasis    Rhinitis    Shingles     Medications:  Medications Prior to Admission  Medication Sig Dispense Refill Last Dose/Taking   allopurinol (ZYLOPRIM) 100 MG tablet Take 100 mg by mouth daily.   02/17/2024   Melatonin 10 MG TABS Take 10 mg by mouth at bedtime as needed (sleep).   02/16/2024   metoprolol  succinate (TOPROL -XL) 50 MG 24 hr tablet Take 25 mg by mouth daily.   02/17/2024   terazosin  (HYTRIN ) 5 MG capsule Take 5 mg by mouth at bedtime.   02/16/2024   Scheduled:   allopurinol  100 mg Oral Daily   Chlorhexidine  Gluconate Cloth  6 each Topical Daily   furosemide  40 mg Intravenous BID   melatonin  5 mg Oral QHS   metoprolol  succinate  12.5 mg Oral Daily   mouth rinse  15 mL Mouth Rinse 4 times per day    Infusions:   amiodarone 30 mg/hr (02/18/24 0400)   heparin 1,400 Units/hr (02/18/24 0400)   PRN:   ASSESSMENT: 78 y.o. male with PMH HTN and anemia is presenting with SOB , chest pain, elevated troponin, and new onset A.fib in RVR. Patient is not on chronic anticoagulation per chart review. Pharmacy has been consulted to initiate and manage heparin intravenous infusion.   Goal(s) of therapy: Heparin level 0.3 - 0.7 units/mL aPTT 66 - 102 seconds Monitor platelets by anticoagulation protocol: Yes   Baseline anticoagulation labs: Recent Labs    02/17/24 0957 02/17/24 1134 02/18/24 0238  APTT  --  29  --   INR  --  1.1  --   HGB 11.2*  --  10.0*  PLT 180  --  177    11/15 @ 0238 HL 0.38 (Draw 4 hours early by lab)   PLAN: 11/15 @ 0238 HL 0.38 - Therapeutic x1  Continue infusion @ 1250 units/hour. Recheck heparin level in 8 hours, then daily once at least two levels are consecutively  therapeutic. Monitor CBC daily while on heparin infusion.   Estill CHRISTELLA Lutes, PharmD, BCPS Clinical Pharmacist 02/18/2024 7:15 AM

## 2024-02-18 NOTE — Consult Note (Signed)
 Pharmacy Consult Note - Anticoagulation  Pharmacy Consult for Heparin drip Indication: atrial fibrillation Allergies  Allergen Reactions   Augmentin [Amoxicillin-Pot Clavulanate] Swelling   Macrolides And Ketolides     Unknown reaction   Oxycodone  Hives   Tetracyclines & Related Rash    Sunburn rash    PATIENT MEASUREMENTS: Height: 5' 8 (172.7 cm) Weight: 90.9 kg (200 lb 6.4 oz) IBW/kg (Calculated) : 68.4 HEPARIN DW (KG): 87.3  VITAL SIGNS: Temp: 98.4 F (36.9 C) (11/15 1200) Temp Source: Oral (11/15 1200) BP: 115/74 (11/15 1200) Pulse Rate: 84 (11/15 1100)  Recent Labs    02/17/24 1134 02/17/24 2059 02/18/24 0238 02/18/24 1112  HGB  --   --  10.0*  --   HCT  --   --  30.7*  --   PLT  --   --  177  --   APTT 29  --   --   --   LABPROT 14.4  --   --   --   INR 1.1  --   --   --   HEPARINUNFRC  --    < > 0.38 0.36  CREATININE  --   --  1.89*  --    < > = values in this interval not displayed.    Estimated Creatinine Clearance: 35.8 mL/min (A) (by C-G formula based on SCr of 1.89 mg/dL (H)).  PAST MEDICAL HISTORY: Past Medical History:  Diagnosis Date   Anemia    Anxiety    Basal cell carcinoma    left cheek and left forearm   History of shingles    Hypertension    Osteoarthritis    Psoriasis    Rhinitis    Shingles     Medications:  Medications Prior to Admission  Medication Sig Dispense Refill Last Dose/Taking   allopurinol (ZYLOPRIM) 100 MG tablet Take 100 mg by mouth daily.   02/17/2024   Melatonin 10 MG TABS Take 10 mg by mouth at bedtime as needed (sleep).   02/16/2024   metoprolol  succinate (TOPROL -XL) 50 MG 24 hr tablet Take 25 mg by mouth daily.   02/17/2024   terazosin  (HYTRIN ) 5 MG capsule Take 5 mg by mouth at bedtime.   02/16/2024   Scheduled:   allopurinol  100 mg Oral Daily   Chlorhexidine  Gluconate Cloth  6 each Topical Daily   furosemide  40 mg Intravenous BID   melatonin  5 mg Oral QHS   metoprolol  succinate  12.5 mg Oral Daily    mouth rinse  15 mL Mouth Rinse 4 times per day   Infusions:   amiodarone 30 mg/hr (02/18/24 0600)   heparin 1,400 Units/hr (02/18/24 0600)   PRN:   ASSESSMENT: 78 y.o. male with PMH HTN and anemia is presenting with SOB , chest pain, elevated troponin, and new onset A.fib in RVR. Patient is not on chronic anticoagulation per chart review. Pharmacy has been consulted to initiate and manage heparin intravenous infusion.   Goal(s) of therapy: Heparin level 0.3 - 0.7 units/mL aPTT 66 - 102 seconds Monitor platelets by anticoagulation protocol: Yes   Baseline anticoagulation labs: Recent Labs    02/17/24 0957 02/17/24 1134 02/18/24 0238  APTT  --  29  --   INR  --  1.1  --   HGB 11.2*  --  10.0*  PLT 180  --  177    11/15 @ 0238 HL 0.38 (Draw 4 hours early by lab)  11/15 @ 1112 HL 0.36, therapeutic x  2  PLAN: 11/15 @ 1112 HL 0.36 - Therapeutic x2  Continue infusion @ 1250 units/hour. Recheck heparin level tomorrow with morning labs Monitor CBC daily while on heparin infusion.   Korey Prashad A Winfred Iiams, PharmD Clinical Pharmacist 02/18/2024 12:33 PM

## 2024-02-18 NOTE — Progress Notes (Signed)
 PROGRESS NOTE    Nathan Boone  FMW:[id number] DOB: [birth year] DOA: 02/17/2024 PCP: Glover Lenis, MD    Assessment & Plan:   Principal Problem:   Atrial fibrillation with rapid ventricular response (HCC)  Assessment and Plan: A.fib w/ RVR: new onset. S/p IV cardizem and cardizem drip. Cardizem d/c and continue on IV amio drip as per cardio. Continue on IV heparin drip. Continue on home dose of metoprolol . Continue on tele. Echo ordered. Cardio following and recs apprec   Possible CHF: unknown systolic vs diastolic vs combined. Echo ordered. Continue on IV lasix. BNP is significantly. Monitor I/Os   Elevated troponin: likely secondary to demand ischemia for a. fib w/ RVR. Cardio following and recs apprec   HTN: continue on home dose of metoprolol     Gout: continue on home dose of allopurinol   AKI: Cr is trending up from day prior. Avoid nephrotoxic meds       DVT prophylaxis: IV heparin dirp Code Status: full  Family Communication: discussed pt's care w/ pt's family at bedside and answered their questions  Disposition Plan: depends on PT/OT recs (not consulted yet)   Level of care: Stepdown  Status is: Inpatient Remains inpatient appropriate because: severity of illness, requiring IV amio drip & IV heparin drip     Consultants:  Cardio   Procedures:  Antimicrobials:   Subjective: Pt c/o malaise   Objective: Vitals:   02/18/24 0500 02/18/24 0600 02/18/24 0630 02/18/24 0700  BP: 100/81 (!) 120/93  115/79  Pulse:  83 99   Resp: (!) 22 (!) 27 (!) 27 19  Temp:      TempSrc:      SpO2: 94% 90% 93%   Weight:      Height:        Intake/Output Summary (Last 24 hours) at 02/18/2024 1004 Last data filed at 02/18/2024 0926 Gross per 24 hour  Intake 653.02 ml  Output 1325 ml  Net -671.98 ml   Filed Weights   02/17/24 0942 02/17/24 1321  Weight: 91.6 kg 90.9 kg    Examination:  General exam: Appears calm and comfortable  Respiratory system:  Clear to auscultation. Respiratory effort normal. Cardiovascular system: irregularly irregular. No rubs, gallops or clicks.  Gastrointestinal system: Abdomen is obese, soft and nontender. Normal bowel sounds heard. Central nervous system: Alert and oriented. Moves all extremities  Psychiatry: Judgement and insight appear normal. Mood & affect appropriate.     Data Reviewed: I have personally reviewed following labs and imaging studies  CBC: Recent Labs  Lab 02/17/24 0957 02/18/24 0238  WBC 7.5 6.1  HGB 11.2* 10.0*  HCT 33.8* 30.7*  MCV 98.8 98.7  PLT 180 177   Basic Metabolic Panel: Recent Labs  Lab 02/17/24 0956 02/17/24 0957 02/18/24 0238  NA  --  138 138  K  --  4.3 4.4  CL  --  103 102  CO2  --  21* 22  GLUCOSE  --  116* 117*  BUN  --  37* 41*  CREATININE  --  1.65* 1.89*  CALCIUM  --  9.0 8.9  MG 2.3  --   --    GFR: Estimated Creatinine Clearance: 35.8 mL/min (A) (by C-G formula based on SCr of 1.89 mg/dL (H)). Liver Function Tests: No results for input(s): AST, ALT, ALKPHOS, BILITOT, PROT, ALBUMIN in the last 168 hours. No results for input(s): LIPASE, AMYLASE in the last 168 hours. No results for input(s): AMMONIA in the last 168 hours. Coagulation  Profile: Recent Labs  Lab 02/17/24 1134  INR 1.1   Cardiac Enzymes: No results for input(s): CKTOTAL, CKMB, CKMBINDEX, TROPONINI in the last 168 hours. BNP (last 3 results) Recent Labs    02/17/24 0957  PROBNP 7,094.0*   HbA1C: No results for input(s): HGBA1C in the last 72 hours. CBG: Recent Labs  Lab 02/17/24 1321  GLUCAP 108*   Lipid Profile: No results for input(s): CHOL, HDL, LDLCALC, TRIG, CHOLHDL, LDLDIRECT in the last 72 hours. Thyroid Function Tests: Recent Labs    02/17/24 1134  TSH 2.550   Anemia Panel: No results for input(s): VITAMINB12, FOLATE, FERRITIN, TIBC, IRON, RETICCTPCT in the last 72 hours. Sepsis Labs: No results  for input(s): PROCALCITON, LATICACIDVEN in the last 168 hours.  Recent Results (from the past 240 hours)  MRSA Next Gen by PCR, Nasal     Status: None   Collection Time: 02/17/24  1:26 PM   Specimen: Nasal Mucosa; Nasal Swab  Result Value Ref Range Status   MRSA by PCR Next Gen NOT DETECTED NOT DETECTED Final    Comment: (NOTE) The GeneXpert MRSA Assay (FDA approved for NASAL specimens only), is one component of a comprehensive MRSA colonization surveillance program. It is not intended to diagnose MRSA infection nor to guide or monitor treatment for MRSA infections. Test performance is not FDA approved in patients less than 9 years old. Performed at [facility], [address] [place], [place] [address]          Radiology Studies: DG Chest [place] 1 View Result Date: 02/17/2024 CLINICAL DATA:  Chest pain EXAM: PORTABLE CHEST 1 VIEW COMPARISON:  None Available. FINDINGS: The cardio pericardial silhouette is enlarged. Vascular congestion with superimposed diffuse interstitial opacity suggests edema. No acute bony abnormality. Telemetry leads overlie the chest. IMPRESSION: Enlargement of the cardiopericardial silhouette with vascular congestion and diffuse interstitial opacity suggesting edema. Electronically Signed   By: Camellia Candle M.D.   On: 02/17/2024 10:14        Scheduled Meds:  allopurinol  100 mg Oral Daily   Chlorhexidine  Gluconate Cloth  6 each Topical Daily   furosemide  40 mg Intravenous BID   melatonin  5 mg Oral QHS   metoprolol  succinate  12.5 mg Oral Daily   mouth rinse  15 mL Mouth Rinse 4 times per day   Continuous Infusions:  amiodarone 30 mg/hr (02/18/24 0600)   heparin 1,400 Units/hr (02/18/24 0600)     LOS: 1 day       Anthony CHRISTELLA Pouch, MD [facility] Pager [phone]  If 7PM-7AM, please contact night-coverage [email or web address] 02/18/2024, 10:04 AM

## 2024-02-19 DIAGNOSIS — I4891 Unspecified atrial fibrillation: Secondary | ICD-10-CM | POA: Diagnosis not present

## 2024-02-19 LAB — CBC
HCT: 30.1 % — ABNORMAL LOW (ref 39.0–52.0)
Hemoglobin: 10.2 g/dL — ABNORMAL LOW (ref 13.0–17.0)
MCH: 33 pg (ref 26.0–34.0)
MCHC: 33.9 g/dL (ref 30.0–36.0)
MCV: 97.4 fL (ref 80.0–100.0)
Platelets: 184 K/uL (ref 150–400)
RBC: 3.09 MIL/uL — ABNORMAL LOW (ref 4.22–5.81)
RDW: 12.8 % (ref 11.5–15.5)
WBC: 5.3 K/uL (ref 4.0–10.5)
nRBC: 0 % (ref 0.0–0.2)

## 2024-02-19 LAB — HEPARIN LEVEL (UNFRACTIONATED)
Heparin Unfractionated: 0.23 [IU]/mL — ABNORMAL LOW (ref 0.30–0.70)
Heparin Unfractionated: 0.5 [IU]/mL (ref 0.30–0.70)
Heparin Unfractionated: 0.46 [IU]/mL (ref 0.30–0.70)

## 2024-02-19 MED ORDER — HEPARIN BOLUS VIA INFUSION
1300.0000 [IU] | Freq: Once | INTRAVENOUS | Status: AC
  Administered 2024-02-19: 1300 [IU] via INTRAVENOUS
  Filled 2024-02-19: qty 1300

## 2024-02-19 MED ORDER — SODIUM CHLORIDE 0.9 % IV SOLN: INTRAVENOUS | Status: AC

## 2024-02-19 MED ORDER — HEPARIN BOLUS VIA INFUSION
1300.0000 [IU] | Freq: Once | INTRAVENOUS | Status: DC
  Filled 2024-02-19: qty 1300

## 2024-02-19 NOTE — Progress Notes (Addendum)
 PROGRESS NOTE    Nathan Boone  FMW:[id number] DOB: 05-30-1945 DOA: 02/17/2024 PCP: Glover Lenis, MD    Assessment & Plan:   Principal Problem:   Atrial fibrillation with rapid ventricular response (HCC)  Assessment and Plan: A.fib w/ RVR: new onset. S/p IV cardizem and cardizem drip. Cardizem d/c. Continue on IV amio & heparin drips as per cardio.  Continue on home dose of metoprolol . Continue on tele. Possible cardioversion prior to d/c as per cardio. Cardio following and recs.   Acute systolic CHF: echo shows EF 20-25%, diastolic function could not be evaluation, LV demonstrates global hypokinesis, mod MR, . Continue on IV lasix, would decrease lasix dose secondary Cr trending up but will defer to cardio. BNP is significantly. Monitor I/Os   Elevated troponin: likely secondary to demand ischemia for a. fib w/ RVR. Cardio following and recs apprec   HTN: continue on home dose of metoprolol     Gout: continue on home dose of allopurinol    AKI: Cr is trending up from day prior. Avoid nephrotoxic meds       DVT prophylaxis: IV heparin dirp Code Status: full  Family Communication:   Disposition Plan: depends on PT/OT recs (will consult when appropriate)   Level of care: Stepdown  Status is: Inpatient Remains inpatient appropriate because: severity of illness, requiring IV amio drip & IV heparin drip     Consultants:  Cardio   Procedures:  Antimicrobials:   Subjective: Pt c/o fatigue   Objective: Vitals:   02/19/24 0500 02/19/24 0600 02/19/24 0726 02/19/24 0800  BP: 108/73 105/74  (!) 130/90  Pulse:  65    Resp: 20 (!) 35  (!) 27  Temp:      TempSrc:      SpO2:  99% 99%   Weight:      Height:        Intake/Output Summary (Last 24 hours) at 02/19/2024 0923 Last data filed at 02/19/2024 0800 Gross per 24 hour  Intake 892.66 ml  Output 2475 ml  Net -1582.34 ml   Filed Weights   02/17/24 0942 02/17/24 1321  Weight: 91.6 kg 90.9 kg     Examination:  General exam: appears comfortable   Respiratory system: decreased breath sounds b/l  Cardiovascular system: irregularly irregular Gastrointestinal system: abd is soft, NT, obese, hypoactive bowel sounds . Central nervous system: alert & oriented. Moves all extremities  Psychiatry: judgement and insight appears at baseline. Appropriate mood and affect    Data Reviewed: I have personally reviewed following labs and imaging studies  CBC: Recent Labs  Lab 02/17/24 0957 02/18/24 0238 02/19/24 0215  WBC 7.5 6.1 5.3  HGB 11.2* 10.0* 10.2*  HCT 33.8* 30.7* 30.1*  MCV 98.8 98.7 97.4  PLT 180 177 184   Basic Metabolic Panel: Recent Labs  Lab 02/17/24 0956 02/17/24 0957 02/18/24 0238  NA  --  138 138  K  --  4.3 4.4  CL  --  103 102  CO2  --  21* 22  GLUCOSE  --  116* 117*  BUN  --  37* 41*  CREATININE  --  1.65* 1.89*  CALCIUM  --  9.0 8.9  MG 2.3  --   --    GFR: Estimated Creatinine Clearance: 35.8 mL/min (A) (by C-G formula based on SCr of 1.89 mg/dL (H)). Liver Function Tests: No results for input(s): AST, ALT, ALKPHOS, BILITOT, PROT, ALBUMIN in the last 168 hours. No results for input(s): LIPASE, AMYLASE in the last  168 hours. No results for input(s): AMMONIA in the last 168 hours. Coagulation Profile: Recent Labs  Lab 02/17/24 1134  INR 1.1   Cardiac Enzymes: No results for input(s): CKTOTAL, CKMB, CKMBINDEX, TROPONINI in the last 168 hours. BNP (last 3 results) Recent Labs    02/17/24 0957  PROBNP 7,094.0*   HbA1C: No results for input(s): HGBA1C in the last 72 hours. CBG: Recent Labs  Lab 02/17/24 1321  GLUCAP 108*   Lipid Profile: No results for input(s): CHOL, HDL, LDLCALC, TRIG, CHOLHDL, LDLDIRECT in the last 72 hours. Thyroid Function Tests: Recent Labs    02/17/24 1134  TSH 2.550   Anemia Panel: No results for input(s): VITAMINB12, FOLATE, FERRITIN, TIBC, IRON,  RETICCTPCT in the last 72 hours. Sepsis Labs: No results for input(s): PROCALCITON, LATICACIDVEN in the last 168 hours.  Recent Results (from the past 240 hours)  MRSA Next Gen by PCR, Nasal     Status: None   Collection Time: 02/17/24  1:26 PM   Specimen: Nasal Mucosa; Nasal Swab  Result Value Ref Range Status   MRSA by PCR Next Gen NOT DETECTED NOT DETECTED Final    Comment: (NOTE) The GeneXpert MRSA Assay (FDA approved for NASAL specimens only), is one component of a comprehensive MRSA colonization surveillance program. It is not intended to diagnose MRSA infection nor to guide or monitor treatment for MRSA infections. Test performance is not FDA approved in patients less than 7 years old. Performed at [facility], [address] [place], [place] [address]          Radiology Studies: ECHOCARDIOGRAM COMPLETE Result Date: 02/18/2024    ECHOCARDIOGRAM REPORT   Patient Name:   Nathan Boone Date of Exam: 02/17/2024 Medical Rec #:  [id number]       Height:       68.0 in Accession #:    [id number]      Weight:       200.4 lb Date of Birth:  [birth year]       BSA:          2.045 m Patient Age:    78 years        BP:           120/88 mmHg Patient Gender: M               HR:           120 bpm. Exam Location:  [facility] Procedure: 2D Echo, Cardiac Doppler and Color Doppler (Both Spectral and Color            Flow Doppler were utilized during procedure). Indications:     Atrial Fibrillation I48.91  History:         Patient has no prior history of Echocardiogram examinations.                  Arrythmias:Atrial Fibrillation.  Sonographer:     Thea Norlander RCS Referring Phys:  [id number] CARALYN HUDSON Diagnosing Phys: Dwayne D Callwood MD IMPRESSIONS  1. Left ventricular ejection fraction, by estimation, is 20 to 25%. The left ventricle has severely decreased function. The left ventricle demonstrates global hypokinesis. There is mild concentric left ventricular hypertrophy. Left  ventricular diastolic  function could not be evaluated.  2. Right ventricular systolic function is mildly reduced. The right ventricular size is normal.  3. Left atrial size was mildly dilated.  4. The mitral valve is normal in structure. Moderate mitral valve regurgitation.  5. The aortic valve  is normal in structure. Aortic valve regurgitation is not visualized.  6. Aortic dilatation noted. There is mild dilatation of the ascending aorta, measuring 40 mm. FINDINGS  Left Ventricle: Left ventricular ejection fraction, by estimation, is 20 to 25%. The left ventricle has severely decreased function. The left ventricle demonstrates global hypokinesis. Strain was performed and the global longitudinal strain is indeterminate. The left ventricular internal cavity size was normal in size. There is mild concentric left ventricular hypertrophy. Left ventricular diastolic function could not be evaluated. Right Ventricle: The right ventricular size is normal. No increase in right ventricular wall thickness. Right ventricular systolic function is mildly reduced. Left Atrium: Left atrial size was mildly dilated. Right Atrium: Right atrial size was normal in size. Pericardium: There is no evidence of pericardial effusion. Mitral Valve: The mitral valve is normal in structure. Moderate mitral valve regurgitation. Tricuspid Valve: The tricuspid valve is normal in structure. Tricuspid valve regurgitation is trivial. Aortic Valve: The aortic valve is normal in structure. Aortic valve regurgitation is not visualized. Aortic valve peak gradient measures 5.3 mmHg. Pulmonic Valve: The pulmonic valve was normal in structure. Pulmonic valve regurgitation is not visualized. Aorta: Aortic dilatation noted. There is mild dilatation of the ascending aorta, measuring 40 mm. IAS/Shunts: No atrial level shunt detected by color flow Doppler. Additional Comments: 3D was performed not requiring image post processing on an independent workstation and  was indeterminate.  LEFT VENTRICLE PLAX 2D LVIDd:         4.40 cm   Diastology LVIDs:         3.20 cm   LV e' medial:    5.66 cm/s LV PW:         1.30 cm   LV E/e' medial:  17.6 LV IVS:        1.10 cm   LV e' lateral:   10.90 cm/s LVOT diam:     2.20 cm   LV E/e' lateral: 9.1 LV SV:         30 LV SV Index:   15 LVOT Area:     3.80 cm  RIGHT VENTRICLE            IVC RV S prime:     8.21 cm/s  IVC diam: 2.40 cm TAPSE (M-mode): 1.3 cm LEFT ATRIUM             Index        RIGHT ATRIUM           Index LA diam:        4.00 cm 1.96 cm/m   RA Area:     16.00 cm LA Vol (A2C):   51.4 ml 25.13 ml/m  RA Volume:   41.20 ml  20.14 ml/m LA Vol (A4C):   83.8 ml 40.97 ml/m LA Biplane Vol: 68.7 ml 33.59 ml/m  AORTIC VALVE AV Area (Vmax): 2.04 cm AV Vmax:        115.00 cm/s AV Peak Grad:   5.3 mmHg LVOT Vmax:      61.83 cm/s LVOT Vmean:     38.100 cm/s LVOT VTI:       0.078 m  AORTA Ao Root diam: 3.60 cm Ao Asc diam:  4.00 cm MITRAL VALVE               TRICUSPID VALVE MV Area (PHT): 4.80 cm    TR Peak grad:   40.4 mmHg MV Decel Time: 158 msec    TR Vmax:  318.00 cm/s MR Peak grad: 71.7 mmHg MR Vmax:      423.50 cm/s  SHUNTS MV E velocity: 99.60 cm/s  Systemic VTI:  0.08 m                            Systemic Diam: 2.20 cm Cara JONETTA Lovelace MD Electronically signed by Cara JONETTA Lovelace MD Signature Date/Time: 02/18/2024/11:19:30 AM    Final    DG Chest Port 1 View Result Date: 02/17/2024 CLINICAL DATA:  Chest pain EXAM: PORTABLE CHEST 1 VIEW COMPARISON:  None Available. FINDINGS: The cardio pericardial silhouette is enlarged. Vascular congestion with superimposed diffuse interstitial opacity suggests edema. No acute bony abnormality. Telemetry leads overlie the chest. IMPRESSION: Enlargement of the cardiopericardial silhouette with vascular congestion and diffuse interstitial opacity suggesting edema. Electronically Signed   By: Camellia Candle M.D.   On: 02/17/2024 10:14        Scheduled Meds:  allopurinol  100  mg Oral Daily   Chlorhexidine  Gluconate Cloth  6 each Topical Daily   furosemide  40 mg Intravenous BID   losartan  25 mg Oral Daily   melatonin  5 mg Oral QHS   metoprolol  succinate  12.5 mg Oral Daily   mouth rinse  15 mL Mouth Rinse 4 times per day   Continuous Infusions:  sodium chloride      amiodarone 30 mg/hr (02/19/24 0700)   heparin 1,600 Units/hr (02/19/24 0700)     LOS: 2 days       Anthony CHRISTELLA Pouch, MD [facility] Pager [phone]  If 7PM-7AM, please contact night-coverage [email or web address] 02/19/2024, 9:23 AM

## 2024-02-19 NOTE — Progress Notes (Signed)
 Patient ID: Nathan Boone, male   DOB: 12/05/1945, 78 y.o.   MRN: [id number] [facility]    SUBJECTIVE: Patient states to be done reasonably well denies any palpitations or tachycardia improved shortness of breath improved weakness rate much improved   Vitals:   02/19/24 0500 02/19/24 0600 02/19/24 0726 02/19/24 0800  BP: 108/73 105/74  (!) 130/90  Pulse:  65    Resp: 20 (!) 35  (!) 27  Temp:      TempSrc:      SpO2:  99% 99%   Weight:      Height:         Intake/Output Summary (Last 24 hours) at 02/19/2024 0957 Last data filed at 02/19/2024 0800 Gross per 24 hour  Intake 892.66 ml  Output 2025 ml  Net -1132.34 ml      PHYSICAL EXAM  General: Well developed, well nourished, in no acute distress HEENT:  Normocephalic and atramatic Neck:  No JVD.  Lungs: Clear bilaterally to auscultation and percussion. Heart: Irregular irregular. Normal S1 and S2 without gallops or murmurs.  Abdomen: Bowel sounds are positive, abdomen soft and non-tender  Msk:  Back normal, normal gait. Normal strength and tone for age. Extremities: No clubbing, cyanosis or edema.   Neuro: Alert and oriented X 3. Psych:  Good affect, responds appropriately   LABS: Basic Metabolic Panel: Recent Labs    02/17/24 0956 02/17/24 0957 02/18/24 0238  NA  --  138 138  K  --  4.3 4.4  CL  --  103 102  CO2  --  21* 22  GLUCOSE  --  116* 117*  BUN  --  37* 41*  CREATININE  --  1.65* 1.89*  CALCIUM  --  9.0 8.9  MG 2.3  --   --    Liver Function Tests: No results for input(s): AST, ALT, ALKPHOS, BILITOT, PROT, ALBUMIN in the last 72 hours. No results for input(s): LIPASE, AMYLASE in the last 72 hours. CBC: Recent Labs    02/18/24 0238 02/19/24 0215  WBC 6.1 5.3  HGB 10.0* 10.2*  HCT 30.7* 30.1*  MCV 98.7 97.4  PLT 177 184   Cardiac Enzymes: No results for input(s): CKTOTAL, CKMB, CKMBINDEX, TROPONINI in the last 72 hours. BNP: Invalid input(s):  POCBNP D-Dimer: No results for input(s): DDIMER in the last 72 hours. Hemoglobin A1C: No results for input(s): HGBA1C in the last 72 hours. Fasting Lipid Panel: No results for input(s): CHOL, HDL, LDLCALC, TRIG, CHOLHDL, LDLDIRECT in the last 72 hours. Thyroid Function Tests: Recent Labs    02/17/24 1134  TSH 2.550   Anemia Panel: No results for input(s): VITAMINB12, FOLATE, FERRITIN, TIBC, IRON, RETICCTPCT in the last 72 hours.  ECHOCARDIOGRAM COMPLETE Result Date: 02/18/2024    ECHOCARDIOGRAM REPORT   Patient Name:   Nathan Boone Date of Exam: 02/17/2024 Medical Rec #:  [id number]       Height:       68.0 in Accession #:    [id number]      Weight:       200.4 lb Date of Birth:  01/26/1946       BSA:          2.045 m Patient Age:    78 years        BP:           120/88 mmHg Patient Gender: M               HR:  120 bpm. Exam Location:  [facility] Procedure: 2D Echo, Cardiac Doppler and Color Doppler (Both Spectral and Color            Flow Doppler were utilized during procedure). Indications:     Atrial Fibrillation I48.91  History:         Patient has no prior history of Echocardiogram examinations.                  Arrythmias:Atrial Fibrillation.  Sonographer:     Thea Norlander RCS Referring Phys:  [id number] CARALYN HUDSON Diagnosing Phys: Raegen Tarpley D Justine Cossin MD IMPRESSIONS  1. Left ventricular ejection fraction, by estimation, is 20 to 25%. The left ventricle has severely decreased function. The left ventricle demonstrates global hypokinesis. There is mild concentric left ventricular hypertrophy. Left ventricular diastolic  function could not be evaluated.  2. Right ventricular systolic function is mildly reduced. The right ventricular size is normal.  3. Left atrial size was mildly dilated.  4. The mitral valve is normal in structure. Moderate mitral valve regurgitation.  5. The aortic valve is normal in structure. Aortic valve regurgitation is not visualized.   6. Aortic dilatation noted. There is mild dilatation of the ascending aorta, measuring 40 mm. FINDINGS  Left Ventricle: Left ventricular ejection fraction, by estimation, is 20 to 25%. The left ventricle has severely decreased function. The left ventricle demonstrates global hypokinesis. Strain was performed and the global longitudinal strain is indeterminate. The left ventricular internal cavity size was normal in size. There is mild concentric left ventricular hypertrophy. Left ventricular diastolic function could not be evaluated. Right Ventricle: The right ventricular size is normal. No increase in right ventricular wall thickness. Right ventricular systolic function is mildly reduced. Left Atrium: Left atrial size was mildly dilated. Right Atrium: Right atrial size was normal in size. Pericardium: There is no evidence of pericardial effusion. Mitral Valve: The mitral valve is normal in structure. Moderate mitral valve regurgitation. Tricuspid Valve: The tricuspid valve is normal in structure. Tricuspid valve regurgitation is trivial. Aortic Valve: The aortic valve is normal in structure. Aortic valve regurgitation is not visualized. Aortic valve peak gradient measures 5.3 mmHg. Pulmonic Valve: The pulmonic valve was normal in structure. Pulmonic valve regurgitation is not visualized. Aorta: Aortic dilatation noted. There is mild dilatation of the ascending aorta, measuring 40 mm. IAS/Shunts: No atrial level shunt detected by color flow Doppler. Additional Comments: 3D was performed not requiring image post processing on an independent workstation and was indeterminate.  LEFT VENTRICLE PLAX 2D LVIDd:         4.40 cm   Diastology LVIDs:         3.20 cm   LV e' medial:    5.66 cm/s LV PW:         1.30 cm   LV E/e' medial:  17.6 LV IVS:        1.10 cm   LV e' lateral:   10.90 cm/s LVOT diam:     2.20 cm   LV E/e' lateral: 9.1 LV SV:         30 LV SV Index:   15 LVOT Area:     3.80 cm  RIGHT VENTRICLE             IVC RV S prime:     8.21 cm/s  IVC diam: 2.40 cm TAPSE (M-mode): 1.3 cm LEFT ATRIUM             Index  RIGHT ATRIUM           Index LA diam:        4.00 cm 1.96 cm/m   RA Area:     16.00 cm LA Vol (A2C):   51.4 ml 25.13 ml/m  RA Volume:   41.20 ml  20.14 ml/m LA Vol (A4C):   83.8 ml 40.97 ml/m LA Biplane Vol: 68.7 ml 33.59 ml/m  AORTIC VALVE AV Area (Vmax): 2.04 cm AV Vmax:        115.00 cm/s AV Peak Grad:   5.3 mmHg LVOT Vmax:      61.83 cm/s LVOT Vmean:     38.100 cm/s LVOT VTI:       0.078 m  AORTA Ao Root diam: 3.60 cm Ao Asc diam:  4.00 cm MITRAL VALVE               TRICUSPID VALVE MV Area (PHT): 4.80 cm    TR Peak grad:   40.4 mmHg MV Decel Time: 158 msec    TR Vmax:        318.00 cm/s MR Peak grad: 71.7 mmHg MR Vmax:      423.50 cm/s  SHUNTS MV E velocity: 99.60 cm/s  Systemic VTI:  0.08 m                            Systemic Diam: 2.20 cm Cara JONETTA Lovelace MD Electronically signed by Cara JONETTA Lovelace MD Signature Date/Time: 02/18/2024/11:19:30 AM    Final    DG Chest Port 1 View Result Date: 02/17/2024 CLINICAL DATA:  Chest pain EXAM: PORTABLE CHEST 1 VIEW COMPARISON:  None Available. FINDINGS: The cardio pericardial silhouette is enlarged. Vascular congestion with superimposed diffuse interstitial opacity suggests edema. No acute bony abnormality. Telemetry leads overlie the chest. IMPRESSION: Enlargement of the cardiopericardial silhouette with vascular congestion and diffuse interstitial opacity suggesting edema. Electronically Signed   By: Camellia Candle M.D.   On: 02/17/2024 10:14     Echo severely depressed left ventricular function EF around 20 to 25%  TELEMETRY: Atrial fibrillation rate of 100 nonspecific ST-T wave changes:  ASSESSMENT AND PLAN:  Principal Problem:   Atrial fibrillation with rapid ventricular response (HCC) Hypertension Acute on chronic renal sufficiency Acute congestive heart failure Elevated troponin Cardiomyopathy EF 20 to 25%  Plan Continue  heart rate control agree with amiodarone for potential rhythm control maintain metoprolol  and anticoagulation for atrial fibrillation Consider TEE cardioversion prior to discharge Continue heart failure therapy beta-blocker losartan consider SGLT 2 as well as spironolactone patient has significant renal insufficiency Recommend nephrology input for medication management for heart failure patient has significant reduced GFR consider SGLT2 MRA ACE ARB or Ina Will have the patient follow-up with heart failure clinic as well as EP Hopefully we can cardiovert the patient prior to discharge continue anticoagulation rate control rhythm control   Cara JONETTA Lovelace, MD 02/19/2024 9:57 AM

## 2024-02-19 NOTE — Plan of Care (Signed)
  Problem: Education: Goal: Knowledge of General Education information will improve Description: Including pain rating scale, medication(s)/side effects and non-pharmacologic comfort measures Outcome: Progressing   Problem: Health Behavior/Discharge Planning: Goal: Ability to manage health-related needs will improve Outcome: Progressing   Problem: Clinical Measurements: Goal: Ability to maintain clinical measurements within normal limits will improve Outcome: Progressing Goal: Diagnostic test results will improve Outcome: Progressing   Problem: Activity: Goal: Risk for activity intolerance will decrease Outcome: Progressing   Problem: Nutrition: Goal: Adequate nutrition will be maintained Outcome: Progressing   Problem: Pain Managment: Goal: General experience of comfort will improve and/or be controlled Outcome: Progressing   Problem: Safety: Goal: Ability to remain free from injury will improve Outcome: Progressing

## 2024-02-19 NOTE — Consult Note (Signed)
 Pharmacy Consult Note - Anticoagulation  Pharmacy Consult for Heparin drip Indication: atrial fibrillation Allergies  Allergen Reactions   Augmentin [Amoxicillin-Pot Clavulanate] Swelling   Macrolides And Ketolides     Unknown reaction   Oxycodone  Hives   Tetracyclines & Related Rash    Sunburn rash    PATIENT MEASUREMENTS: Height: 5' 8 (172.7 cm) Weight: 90.9 kg (200 lb 6.4 oz) IBW/kg (Calculated) : 68.4 HEPARIN DW (KG): 87.3  VITAL SIGNS: Temp: 98.5 F (36.9 C) (11/16 0400) Temp Source: Oral (11/16 0400) BP: 101/68 (11/16 0400) Pulse Rate: 70 (11/16 0300)  Recent Labs    02/17/24 1134 02/17/24 2059 02/18/24 0238 02/18/24 1112 02/19/24 0215  HGB  --   --  10.0*  --  10.2*  HCT  --   --  30.7*  --  30.1*  PLT  --   --  177  --  184  APTT 29  --   --   --   --   LABPROT 14.4  --   --   --   --   INR 1.1  --   --   --   --   HEPARINUNFRC  --    < > 0.38   < > 0.23*  CREATININE  --   --  1.89*  --   --    < > = values in this interval not displayed.    Estimated Creatinine Clearance: 35.8 mL/min (A) (by C-G formula based on SCr of 1.89 mg/dL (H)).  PAST MEDICAL HISTORY: Past Medical History:  Diagnosis Date   Anemia    Anxiety    Basal cell carcinoma    left cheek and left forearm   History of shingles    Hypertension    Osteoarthritis    Psoriasis    Rhinitis    Shingles     Medications:  Medications Prior to Admission  Medication Sig Dispense Refill Last Dose/Taking   allopurinol (ZYLOPRIM) 100 MG tablet Take 100 mg by mouth daily.   02/17/2024   Melatonin 10 MG TABS Take 10 mg by mouth at bedtime as needed (sleep).   02/16/2024   metoprolol  succinate (TOPROL -XL) 50 MG 24 hr tablet Take 25 mg by mouth daily.   02/17/2024   terazosin  (HYTRIN ) 5 MG capsule Take 5 mg by mouth at bedtime.   02/16/2024   Scheduled:   allopurinol  100 mg Oral Daily   Chlorhexidine  Gluconate Cloth  6 each Topical Daily   furosemide  40 mg Intravenous BID   losartan   25 mg Oral Daily   melatonin  5 mg Oral QHS   metoprolol  succinate  12.5 mg Oral Daily   mouth rinse  15 mL Mouth Rinse 4 times per day   Infusions:   amiodarone 30 mg/hr (02/19/24 0400)   heparin 1,400 Units/hr (02/19/24 0400)   PRN:   ASSESSMENT: 78 y.o. male with PMH HTN and anemia is presenting with SOB , chest pain, elevated troponin, and new onset A.fib in RVR. Patient is not on chronic anticoagulation per chart review. Pharmacy has been consulted to initiate and manage heparin intravenous infusion.   Goal(s) of therapy: Heparin level 0.3 - 0.7 units/mL aPTT 66 - 102 seconds Monitor platelets by anticoagulation protocol: Yes   Baseline anticoagulation labs: Recent Labs    02/17/24 0957 02/17/24 1134 02/18/24 0238 02/19/24 0215  APTT  --  29  --   --   INR  --  1.1  --   --  HGB 11.2*  --  10.0* 10.2*  PLT 180  --  177 184    11/15 @ 0238 HL 0.38 (Draw 4 hours early by lab)  11/15 @ 1112 HL 0.36, therapeutic x 2 11/16 0215 HL 0.23, subtherapeutic  PLAN: Bolus 1300 units x 1 Increase heparin infusion to 1600 units/hour. Recheck HL in 8 hrs after rate change Monitor CBC daily while on heparin infusion.  Rankin CANDIE Dills, PharmD, [facility] 02/19/2024 4:49 AM

## 2024-02-19 NOTE — Consult Note (Signed)
 Pharmacy Consult Note - Anticoagulation  Pharmacy Consult for Heparin drip Indication: atrial fibrillation Allergies  Allergen Reactions   Augmentin [Amoxicillin-Pot Clavulanate] Swelling   Macrolides And Ketolides     Unknown reaction   Oxycodone  Hives   Tetracyclines & Related Rash    Sunburn rash    PATIENT MEASUREMENTS: Height: 5' 8 (172.7 cm) Weight: 90.9 kg (200 lb 6.4 oz) IBW/kg (Calculated) : 68.4 HEPARIN DW (KG): 87.3  VITAL SIGNS: Temp: 97.9 F (36.6 C) (11/16 2000) Temp Source: Oral (11/16 2000) BP: 95/66 (11/16 2100) Pulse Rate: 122 (11/16 1900)  Recent Labs    02/17/24 1134 02/17/24 2059 02/18/24 0238 02/18/24 1112 02/19/24 0215 02/19/24 1237 02/19/24 2052  HGB  --   --  10.0*  --  10.2*  --   --   HCT  --   --  30.7*  --  30.1*  --   --   PLT  --   --  177  --  184  --   --   APTT 29  --   --   --   --   --   --   LABPROT 14.4  --   --   --   --   --   --   INR 1.1  --   --   --   --   --   --   HEPARINUNFRC  --    < > 0.38   < > 0.23*   < > 0.46  CREATININE  --   --  1.89*  --   --   --   --    < > = values in this interval not displayed.    Estimated Creatinine Clearance: 35.8 mL/min (A) (by C-G formula based on SCr of 1.89 mg/dL (H)).  PAST MEDICAL HISTORY: Past Medical History:  Diagnosis Date   Anemia    Anxiety    Basal cell carcinoma    left cheek and left forearm   History of shingles    Hypertension    Osteoarthritis    Psoriasis    Rhinitis    Shingles     Medications:  Medications Prior to Admission  Medication Sig Dispense Refill Last Dose/Taking   allopurinol (ZYLOPRIM) 100 MG tablet Take 100 mg by mouth daily.   02/17/2024   Melatonin 10 MG TABS Take 10 mg by mouth at bedtime as needed (sleep).   02/16/2024   metoprolol  succinate (TOPROL -XL) 50 MG 24 hr tablet Take 25 mg by mouth daily.   02/17/2024   terazosin  (HYTRIN ) 5 MG capsule Take 5 mg by mouth at bedtime.   02/16/2024   Scheduled:   allopurinol  100 mg Oral  Daily   Chlorhexidine  Gluconate Cloth  6 each Topical Daily   furosemide  40 mg Intravenous BID   losartan  25 mg Oral Daily   melatonin  5 mg Oral QHS   metoprolol  succinate  12.5 mg Oral Daily   mouth rinse  15 mL Mouth Rinse 4 times per day   Infusions:   sodium chloride      amiodarone 30 mg/hr (02/19/24 2100)   heparin 1,600 Units/hr (02/19/24 2100)   PRN:   ASSESSMENT: 78 y.o. male with PMH HTN and anemia is presenting with SOB , chest pain, elevated troponin, and new onset A.fib in RVR. Patient is not on chronic anticoagulation per chart review. Pharmacy has been consulted to initiate and manage heparin intravenous infusion.   Goal(s) of therapy: Heparin  level 0.3 - 0.7 units/mL aPTT 66 - 102 seconds Monitor platelets by anticoagulation protocol: Yes   Baseline anticoagulation labs: Recent Labs    02/17/24 0957 02/17/24 1134 02/18/24 0238 02/19/24 0215  APTT  --  29  --   --   INR  --  1.1  --   --   HGB 11.2*  --  10.0* 10.2*  PLT 180  --  177 184    11/15 @ 0238 HL 0.38 (Draw 4 hours early by lab)  11/15 @ 1112 HL 0.36, therapeutic x 2 11/16 @ 0215 HL 0.23, subtherapeutic 11/16 @ 1237 HL 0.50, therapeutic x 1 11/16 @ 2052 Hl  0.46, therapeutic x2   PLAN: Continue heparin infusion at 1600 units/hour. Recheck confirmatory HL daily with AM labs Monitor CBC daily while on heparin infusion.  Annabella LOISE Banks, PharmD Clinical Pharmacist 02/19/2024 10:04 PM

## 2024-02-19 NOTE — Care Management Important Message (Signed)
 Important Message  Patient Details  Name: CAEDON BOND MRN: [id number] Date of Birth: [birth year]   Important Message Given:  Yes - Medicare IM     Rojelio SHAUNNA Rattler 02/19/2024, 6:02 PM

## 2024-02-19 NOTE — Consult Note (Signed)
 Pharmacy Consult Note - Anticoagulation  Pharmacy Consult for Heparin drip Indication: atrial fibrillation Allergies  Allergen Reactions   Augmentin [Amoxicillin-Pot Clavulanate] Swelling   Macrolides And Ketolides     Unknown reaction   Oxycodone  Hives   Tetracyclines & Related Rash    Sunburn rash    PATIENT MEASUREMENTS: Height: 5' 8 (172.7 cm) Weight: 90.9 kg (200 lb 6.4 oz) IBW/kg (Calculated) : 68.4 HEPARIN DW (KG): 87.3  VITAL SIGNS: Temp: 97.9 F (36.6 C) (11/16 1200) Temp Source: Oral (11/16 0400) BP: 103/65 (11/16 1300) Pulse Rate: 103 (11/16 1300)  Recent Labs    02/17/24 1134 02/17/24 2059 02/18/24 0238 02/18/24 1112 02/19/24 0215 02/19/24 1237  HGB  --   --  10.0*  --  10.2*  --   HCT  --   --  30.7*  --  30.1*  --   PLT  --   --  177  --  184  --   APTT 29  --   --   --   --   --   LABPROT 14.4  --   --   --   --   --   INR 1.1  --   --   --   --   --   HEPARINUNFRC  --    < > 0.38   < > 0.23* 0.50  CREATININE  --   --  1.89*  --   --   --    < > = values in this interval not displayed.    Estimated Creatinine Clearance: 35.8 mL/min (A) (by C-G formula based on SCr of 1.89 mg/dL (H)).  PAST MEDICAL HISTORY: Past Medical History:  Diagnosis Date   Anemia    Anxiety    Basal cell carcinoma    left cheek and left forearm   History of shingles    Hypertension    Osteoarthritis    Psoriasis    Rhinitis    Shingles     Medications:  Medications Prior to Admission  Medication Sig Dispense Refill Last Dose/Taking   allopurinol (ZYLOPRIM) 100 MG tablet Take 100 mg by mouth daily.   02/17/2024   Melatonin 10 MG TABS Take 10 mg by mouth at bedtime as needed (sleep).   02/16/2024   metoprolol  succinate (TOPROL -XL) 50 MG 24 hr tablet Take 25 mg by mouth daily.   02/17/2024   terazosin  (HYTRIN ) 5 MG capsule Take 5 mg by mouth at bedtime.   02/16/2024   Scheduled:   allopurinol  100 mg Oral Daily   Chlorhexidine  Gluconate Cloth  6 each Topical  Daily   furosemide  40 mg Intravenous BID   losartan  25 mg Oral Daily   melatonin  5 mg Oral QHS   metoprolol  succinate  12.5 mg Oral Daily   mouth rinse  15 mL Mouth Rinse 4 times per day   Infusions:   sodium chloride      amiodarone 30 mg/hr (02/19/24 1300)   heparin 1,600 Units/hr (02/19/24 1300)   PRN:   ASSESSMENT: 78 y.o. male with PMH HTN and anemia is presenting with SOB , chest pain, elevated troponin, and new onset A.fib in RVR. Patient is not on chronic anticoagulation per chart review. Pharmacy has been consulted to initiate and manage heparin intravenous infusion.   Goal(s) of therapy: Heparin level 0.3 - 0.7 units/mL aPTT 66 - 102 seconds Monitor platelets by anticoagulation protocol: Yes   Baseline anticoagulation labs: Recent Labs    02/17/24  9042 02/17/24 1134 02/18/24 0238 02/19/24 0215  APTT  --  29  --   --   INR  --  1.1  --   --   HGB 11.2*  --  10.0* 10.2*  PLT 180  --  177 184    11/15 @ 0238 HL 0.38 (Draw 4 hours early by lab)  11/15 @ 1112 HL 0.36, therapeutic x 2 11/16 @ 0215 HL 0.23, subtherapeutic 11/16 @ 1237 HL 0.50, therapeutic x 1   PLAN: Continue heparin infusion at 1600 units/hour. Recheck confirmatory HL in 8 hrs Monitor CBC daily while on heparin infusion.  Kendle Turbin A Zhuri Krass, PharmD Clinical Pharmacist 02/19/2024 1:51 PM

## 2024-02-20 ENCOUNTER — Encounter
Admission: EM | Disposition: A | Discharge status: Home / Self Care | Payer: Self-pay | Source: Home / Self Care | Attending: Internal Medicine

## 2024-02-20 ENCOUNTER — Other Ambulatory Visit: Payer: Self-pay

## 2024-02-20 ENCOUNTER — Telehealth (HOSPITAL_COMMUNITY): Payer: Self-pay | Admitting: Pharmacy Technician

## 2024-02-20 ENCOUNTER — Inpatient Hospital Stay: Admitting: Anesthesiology

## 2024-02-20 ENCOUNTER — Inpatient Hospital Stay: Admit: 2024-02-20 | Discharge: 2024-02-20 | Disposition: A | Attending: Student

## 2024-02-20 ENCOUNTER — Other Ambulatory Visit (HOSPITAL_COMMUNITY): Payer: Self-pay

## 2024-02-20 DIAGNOSIS — I4891 Unspecified atrial fibrillation: Secondary | ICD-10-CM | POA: Diagnosis not present

## 2024-02-20 DIAGNOSIS — I361 Nonrheumatic tricuspid (valve) insufficiency: Secondary | ICD-10-CM

## 2024-02-20 DIAGNOSIS — I340 Nonrheumatic mitral (valve) insufficiency: Secondary | ICD-10-CM

## 2024-02-20 HISTORY — PX: TEE WITHOUT CARDIOVERSION: SHX5443

## 2024-02-20 LAB — CBC
HCT: 31 % — ABNORMAL LOW (ref 39.0–52.0)
Hemoglobin: 10.4 g/dL — ABNORMAL LOW (ref 13.0–17.0)
MCH: 33.1 pg (ref 26.0–34.0)
MCHC: 33.5 g/dL (ref 30.0–36.0)
MCV: 98.7 fL (ref 80.0–100.0)
Platelets: 203 K/uL (ref 150–400)
RBC: 3.14 MIL/uL — ABNORMAL LOW (ref 4.22–5.81)
RDW: 12.6 % (ref 11.5–15.5)
WBC: 4.5 K/uL (ref 4.0–10.5)
nRBC: 0 % (ref 0.0–0.2)

## 2024-02-20 LAB — BASIC METABOLIC PANEL WITH GFR
Anion gap: 10 (ref 5–15)
BUN: 40 mg/dL — ABNORMAL HIGH (ref 8–23)
CO2: 26 mmol/L (ref 22–32)
Calcium: 8.8 mg/dL — ABNORMAL LOW (ref 8.9–10.3)
Chloride: 104 mmol/L (ref 98–111)
Creatinine, Ser: 1.55 mg/dL — ABNORMAL HIGH (ref 0.61–1.24)
GFR, Estimated: 46 mL/min — ABNORMAL LOW (ref >60)
Glucose, Bld: 101 mg/dL — ABNORMAL HIGH (ref 70–99)
Potassium: 3.6 mmol/L (ref 3.5–5.1)
Sodium: 140 mmol/L (ref 135–145)

## 2024-02-20 LAB — HEPARIN LEVEL (UNFRACTIONATED): Heparin Unfractionated: 0.52 [IU]/mL (ref 0.30–0.70)

## 2024-02-20 LAB — ECHO TEE

## 2024-02-20 SURGERY — ECHOCARDIOGRAM, TRANSESOPHAGEAL
Anesthesia: General

## 2024-02-20 SURGERY — ECHOCARDIOGRAM, TRANSESOPHAGEAL
Anesthesia: Monitor Anesthesia Care

## 2024-02-20 MED ORDER — APIXABAN 5 MG PO TABS
5.0000 mg | ORAL_TABLET | Freq: Two times a day (BID) | ORAL | Status: DC
  Administered 2024-02-20 – 2024-02-21 (×3): 5 mg via ORAL
  Filled 2024-02-20 (×3): qty 1

## 2024-02-20 MED ORDER — DOCUSATE SODIUM 100 MG PO CAPS
200.0000 mg | ORAL_CAPSULE | Freq: Two times a day (BID) | ORAL | Status: DC
  Administered 2024-02-21: 200 mg via ORAL
  Filled 2024-02-20 (×2): qty 2

## 2024-02-20 MED ORDER — PHENYLEPHRINE HCL (PRESSORS) 10 MG/ML IV SOLN
INTRAVENOUS | Status: DC | PRN
  Administered 2024-02-20: 160 ug via INTRAVENOUS

## 2024-02-20 MED ORDER — AMIODARONE HCL 200 MG PO TABS
200.0000 mg | ORAL_TABLET | Freq: Every day | ORAL | Status: DC
  Filled 2024-02-20: qty 1

## 2024-02-20 MED ORDER — METOPROLOL SUCCINATE ER 25 MG PO TB24
25.0000 mg | ORAL_TABLET | Freq: Every day | ORAL | Status: DC
  Administered 2024-02-21: 25 mg via ORAL
  Filled 2024-02-20: qty 1

## 2024-02-20 MED ORDER — AMIODARONE HCL 200 MG PO TABS
400.0000 mg | ORAL_TABLET | Freq: Two times a day (BID) | ORAL | Status: DC
  Administered 2024-02-20 – 2024-02-21 (×3): 400 mg via ORAL
  Filled 2024-02-20 (×4): qty 2

## 2024-02-20 MED ORDER — PROPOFOL 10 MG/ML IV BOLUS
INTRAVENOUS | Status: DC | PRN
  Administered 2024-02-20 (×3): 20 mg via INTRAVENOUS
  Administered 2024-02-20: 50 mg via INTRAVENOUS
  Administered 2024-02-20: 20 mg via INTRAVENOUS

## 2024-02-20 MED ORDER — BUTAMBEN-TETRACAINE-BENZOCAINE 2-2-14 % EX AERO
INHALATION_SPRAY | CUTANEOUS | Status: AC
  Filled 2024-02-20: qty 5

## 2024-02-20 MED ORDER — LIDOCAINE VISCOUS HCL 2 % MT SOLN
OROMUCOSAL | Status: AC
  Filled 2024-02-20: qty 15

## 2024-02-20 MED ORDER — SODIUM CHLORIDE 0.9 % IV SOLN: INTRAVENOUS | Status: DC

## 2024-02-20 NOTE — TOC Initial Note (Signed)
 Transition of Care [facility]) - Initial/Assessment Note    Patient Details  Name: Nathan Boone MRN: [id number] Date of Birth: 1945-12-10  [facility]) CM/SW Contact:    Corrie JINNY Ruts, LCSW Phone Number: 02/20/2024, 11:51 AM  Clinical Narrative:                 Chart reviewed. The patient was admitted got Atrial fibrillation w/ rapid ventricular response. I was able to speak with the patient and his wife at bedside today. I introduced myself, my role, and reason for consult. The patient reports that he is doing well today. The patient confirmed PCP. The patient reports that he lives in the home with his wife. The patient reports that he works full time and is able to complete daily living task independently before being admitted.   The patient reports that he drives himself to medical appointments. The patient wife will assist the patient during D/C. The patient reports that he uses [facility] pharmacy in [place]. The patient reports that he has never had HH or been admitted into a SNF in the past. The patient reports that he has a walker, shower seat, and BSC. The patient reports no other concerns during the time of the consult.   TOC will follow the patient until D/C.         Patient Goals and CMS Choice            Expected Discharge Plan and Services                                              Prior Living Arrangements/Services                       Activities of Daily Living      Permission Sought/Granted                  Emotional Assessment              Admission diagnosis:  Hypoxia [R09.02] Elevated troponin [R79.89] Atrial fibrillation with rapid ventricular response (HCC) [I48.91] Atrial fibrillation with RVR (HCC) [I48.91] Acute on chronic congestive heart failure, unspecified heart failure type [facility]) [I50.9] Patient Active Problem List   Diagnosis Date Noted   Atrial fibrillation with rapid ventricular response  (HCC) 02/17/2024   S/P TKR (total knee replacement) using cement, 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   PCP:  Glover Lenis, MD Pharmacy:   [id number] [place], [place] - [address] AT [facility] [address] [place] [address] Phone: [phone] Fax: [phone]     Social Drivers of Health (SDOH) Social History: SDOH Screenings   Food Insecurity: No Food Insecurity (02/17/2024)  Housing: Unknown (02/17/2024)  Transportation Needs: No Transportation Needs (02/17/2024)  Utilities: Not At Risk (02/17/2024)  Financial Resource Strain: Low Risk  (06/10/2023)   Received from [facility]  Social Connections: Socially Integrated (02/17/2024)  Tobacco Use: Medium Risk (02/17/2024)   SDOH Interventions:     Readmission Risk Interventions     No data to display

## 2024-02-20 NOTE — Plan of Care (Signed)
  Problem: Education: Goal: Knowledge of General Education information will improve Description: Including pain rating scale, medication(s)/side effects and non-pharmacologic comfort measures Outcome: Progressing   Problem: Health Behavior/Discharge Planning: Goal: Ability to manage health-related needs will improve Outcome: Progressing   Problem: Clinical Measurements: Goal: Ability to maintain clinical measurements within normal limits will improve Outcome: Progressing Goal: Will remain free from infection Outcome: Progressing   Problem: Activity: Goal: Risk for activity intolerance will decrease Outcome: Progressing   Problem: Nutrition: Goal: Adequate nutrition will be maintained Outcome: Progressing   Problem: Coping: Goal: Level of anxiety will decrease Outcome: Progressing   Problem: Safety: Goal: Ability to remain free from injury will improve Outcome: Progressing   Problem: Skin Integrity: Goal: Risk for impaired skin integrity will decrease Outcome: Progressing   Problem: Cardiac: Goal: Ability to achieve and maintain adequate cardiopulmonary perfusion will improve Outcome: Progressing   Problem: Health Behavior/Discharge Planning: Goal: Ability to safely manage health-related needs after discharge will improve Outcome: Progressing

## 2024-02-20 NOTE — CV Procedure (Signed)
   TRANSESOPHAGEAL ECHOCARDIOGRAM GUIDED DIRECT CURRENT CARDIOVERSION  NAME:  Nathan Boone    MRN: [id number] DOB:  08/13/1945    ADMIT DATE: 02/17/2024  INDICATIONS: Symptomatic atrial fibrillation  PROCEDURE:   Informed consent was obtained prior to the procedure. The risks, benefits and alternatives for the procedure were discussed and the patient comprehended these risks.  Risks include, but are not limited to, cough, sore throat, vomiting, nausea, somnolence, esophageal and stomach trauma or perforation, bleeding, low blood pressure, aspiration, pneumonia, infection, trauma to the teeth and death.    After a procedural time-out, the oropharynx was anesthetized and the patient was sedated by the anesthesia service. The transesophageal probe was inserted in the esophagus without difficulty and multiple views were obtained.  COMPLICATIONS:    Complications: No complications Patient tolerated procedure well.  KEY FINDINGS:  Severely reduced LV function, moderately reduced RV function. No LAA thrombus. Mild MR/TR. Full Report to follow.   CARDIOVERSION:     Indications:  Symptomatic Atrial Fibrillation  Procedure Details:  Once the TEE was complete, the patient had the defibrillator pads placed in the anterior and posterior position. Once an appropriate level of sedation was confirmed, the patient was cardioverted x 1 with 200J of biphasic synchronized energy.  The patient converted to NSR with PACs.  There were no apparent complications.  The patient had normal neuro status and respiratory status post procedure with vitals stable as recorded elsewhere.  Adequate airway was maintained throughout and vital signs monitored per protocol.  Signed, Caron Poser, MD

## 2024-02-20 NOTE — Progress Notes (Signed)
 [facility] CLINIC CARDIOLOGY PROGRESS NOTE       Patient ID: Nathan Boone MRN: [id number] DOB/AGE: 1945/09/30 78 y.o.  Admit date: 02/17/2024 Referring Physician Dr. Katha Gail Primary Physician Glover Lenis, MD  Primary Cardiologist None [facility] PCP) Reason for Consultation New onset AF, new HF  HPI: Nathan Boone is a 78 y.o. male  with a past medical history of hypertension, anemia, anxiety who presented to the ED on 02/17/2024 for SOB, CP. Cardiology was consulted for further evaluation.   Interval history: -Patient seen and examined this AM, resting comfortably in hospital bed.  -Reports feeling well today, SOB much improved.  -Remains in atrial fibrillation with controlled HR in the 80s.  Review of systems complete and found to be negative unless listed above    Past Medical History:  Diagnosis Date   Anemia    Anxiety    Basal cell carcinoma    left cheek and left forearm   History of shingles    Hypertension    Osteoarthritis    Psoriasis    Rhinitis    Shingles     Past Surgical History:  Procedure Laterality Date   basal cell carcinoma removal     left cheek and left arm, left elbow and right arm   CARDIAC CATHETERIZATION     CARPAL TUNNEL RELEASE Right    CARPAL TUNNEL RELEASE Right 02/23/2022   Procedure: CARPAL TUNNEL RELEASE;  Surgeon: Kathlynn Sharper, MD;  Location: [facility];  Service: Orthopedics;  Laterality: Right;   CATARACT EXTRACTION W/ INTRAOCULAR LENS IMPLANT Left 06/11/2020   CATARACT EXTRACTION W/ INTRAOCULAR LENS IMPLANT Right 06/25/2020   COLONOSCOPY     JOINT REPLACEMENT Right 01/17/2014   knee   KNEE ARTHROSCOPY Right    KNEE ARTHROSCOPY WITH MEDIAL MENISECTOMY Left 08/18/2017   Procedure: KNEE ARTHROSCOPY WITH PARTIAL MEDIAL AND LATERAL MENISECTOMY WITH PARTIAL SYNOVECTOMY;  Surgeon: Kathlynn Sharper, MD;  Location: [facility];  Service: Orthopedics;  Laterality: Left;   KNEE CLOSED REDUCTION Left 12/15/2020   Procedure:  CLOSED MANIPULATION KNEE;  Surgeon: Kathlynn Sharper, MD;  Location: [facility];  Service: Orthopedics;  Laterality: Left;   RETINAL LASER PROCEDURE Right    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   02/17/2024   Melatonin 10 MG TABS Take 10 mg by mouth at bedtime as needed (sleep).   02/16/2024   metoprolol  succinate (TOPROL -XL) 50 MG 24 hr tablet Take 25 mg by mouth daily.   02/17/2024   terazosin  (HYTRIN ) 5 MG capsule Take 5 mg by mouth at bedtime.   02/16/2024   Social History   Socioeconomic History   Marital status: Married    Spouse name: Mary   Number of children: 1   Years of education: Not on file   Highest education level: Not on file  Occupational History   Not on file  Tobacco Use   Smoking status: Former    Current packs/day: 0.00    Types: Cigarettes    Quit date: 1970    Years since quitting: 55.9   Smokeless tobacco: Never  Vaping Use   Vaping status: Never Used  Substance and Sexual Activity   Alcohol use: Yes    Alcohol/week: 11.0 standard drinks  of alcohol    Types: 7 Glasses of wine, 4 Cans of beer per week    Comment: Occasional   Drug use: Never   Sexual activity: Not on file  Other Topics Concern   Not on file  Social History Narrative   Not on file   Social Drivers of Health   Financial Resource Strain: Low Risk  (06/10/2023)   Received from [facility]   Overall Financial Resource Strain (CARDIA)    Difficulty of Paying Living Expenses: Not hard at all  Food Insecurity: No Food Insecurity (02/17/2024)   Hunger Vital Sign    Worried About Running Out of Food in the Last Year: Never true    Ran Out of Food in the Last Year: Never true  Transportation  Needs: No Transportation Needs (02/17/2024)   PRAPARE - Administrator, Civil Service (Medical): No    Lack of Transportation (Non-Medical): No  Physical Activity: Not on file  Stress: Not on file  Social Connections: Socially Integrated (02/17/2024)   Social Connection and Isolation Panel    Frequency of Communication with Friends and Family: More than three times a week    Frequency of Social Gatherings with Friends and Family: Twice a week    Attends Religious Services: More than 4 times per year    Active Member of [employer] or Organizations: Yes    Attends Banker Meetings: More than 4 times per year    Marital Status: Married  Catering Manager Violence: Not At Risk (02/17/2024)   Humiliation, Afraid, Rape, and Kick questionnaire    Fear of Current or Ex-Partner: No    Emotionally Abused: No    Physically Abused: No    Sexually Abused: No    History reviewed. No pertinent family history.   Vitals:   02/20/24 0600 02/20/24 0700 02/20/24 0800 02/20/24 0904  BP: 116/89 109/76 105/73 (!) 151/88  Pulse: 98  86   Resp: (!) 21 (!) 23 (!) 24 20  Temp:   97.8 F (36.6 C)   TempSrc:   Oral   SpO2: 97%  97%   Weight:      Height:        PHYSICAL EXAM General: Well-appearing elderly male, well nourished, in no acute distress. HEENT: Normocephalic and atraumatic. Neck: No JVD.  Lungs: Normal respiratory effort on 3L [place]. Heart: Irregularly irregular, elevated rate. Normal S1 and S2 without gallops or murmurs.  Abdomen: Non-distended appearing.  Msk: Normal strength and tone for age. Extremities: Warm and well perfused. No clubbing, cyanosis.  Trace edema.  Neuro: Alert and oriented X 3. Psych: Answers questions appropriately.   Labs: Basic Metabolic Panel: Recent Labs    02/17/24 0956 02/17/24 0957 02/18/24 0238 02/20/24 0402  NA  --    < > 138 140  K  --    < > 4.4 3.6  CL  --    < > 102 104  CO2  --    < > 22 26  GLUCOSE  --    < > 117* 101*   BUN  --    < > 41* 40*  CREATININE  --    < > 1.89* 1.55*  CALCIUM  --    < > 8.9 8.8*  MG 2.3  --   --   --    < > = values in this interval not displayed.   Liver Function Tests: No results for input(s): AST, ALT, ALKPHOS, BILITOT, PROT,  ALBUMIN in the last 72 hours. No results for input(s): LIPASE, AMYLASE in the last 72 hours. CBC: Recent Labs    02/19/24 0215 02/20/24 0402  WBC 5.3 4.5  HGB 10.2* 10.4*  HCT 30.1* 31.0*  MCV 97.4 98.7  PLT 184 203   Cardiac Enzymes: No results for input(s): CKTOTAL, CKMB, CKMBINDEX, TROPONINIHS in the last 72 hours. BNP: No results for input(s): BNP in the last 72 hours. D-Dimer: No results for input(s): DDIMER in the last 72 hours. Hemoglobin A1C: No results for input(s): HGBA1C in the last 72 hours. Fasting Lipid Panel: No results for input(s): CHOL, HDL, LDLCALC, TRIG, CHOLHDL, LDLDIRECT in the last 72 hours. Thyroid Function Tests: Recent Labs    02/17/24 1134  TSH 2.550   Anemia Panel: No results for input(s): VITAMINB12, FOLATE, FERRITIN, TIBC, IRON, RETICCTPCT in the last 72 hours.   Radiology: ECHOCARDIOGRAM COMPLETE Result Date: 02/18/2024    ECHOCARDIOGRAM REPORT   Patient Name:   Nathan Boone Date of Exam: 02/17/2024 Medical Rec #:  [id number]       Height:       68.0 in Accession #:    [id number]      Weight:       200.4 lb Date of Birth:  Apr 29, 1945       BSA:          2.045 m Patient Age:    78 years        BP:           120/88 mmHg Patient Gender: M               HR:           120 bpm. Exam Location:  [facility] Procedure: 2D Echo, Cardiac Doppler and Color Doppler (Both Spectral and Color            Flow Doppler were utilized during procedure). Indications:     Atrial Fibrillation I48.91  History:         Patient has no prior history of Echocardiogram examinations.                  Arrythmias:Atrial Fibrillation.  Sonographer:     Thea Norlander RCS Referring Phys:   [id number] Arpita Fentress Diagnosing Phys: Dwayne D Callwood MD IMPRESSIONS  1. Left ventricular ejection fraction, by estimation, is 20 to 25%. The left ventricle has severely decreased function. The left ventricle demonstrates global hypokinesis. There is mild concentric left ventricular hypertrophy. Left ventricular diastolic  function could not be evaluated.  2. Right ventricular systolic function is mildly reduced. The right ventricular size is normal.  3. Left atrial size was mildly dilated.  4. The mitral valve is normal in structure. Moderate mitral valve regurgitation.  5. The aortic valve is normal in structure. Aortic valve regurgitation is not visualized.  6. Aortic dilatation noted. There is mild dilatation of the ascending aorta, measuring 40 mm. FINDINGS  Left Ventricle: Left ventricular ejection fraction, by estimation, is 20 to 25%. The left ventricle has severely decreased function. The left ventricle demonstrates global hypokinesis. Strain was performed and the global longitudinal strain is indeterminate. The left ventricular internal cavity size was normal in size. There is mild concentric left ventricular hypertrophy. Left ventricular diastolic function could not be evaluated. Right Ventricle: The right ventricular size is normal. No increase in right ventricular wall thickness. Right ventricular systolic function is mildly reduced. Left Atrium: Left atrial size was mildly dilated. Right Atrium: Right atrial size  was normal in size. Pericardium: There is no evidence of pericardial effusion. Mitral Valve: The mitral valve is normal in structure. Moderate mitral valve regurgitation. Tricuspid Valve: The tricuspid valve is normal in structure. Tricuspid valve regurgitation is trivial. Aortic Valve: The aortic valve is normal in structure. Aortic valve regurgitation is not visualized. Aortic valve peak gradient measures 5.3 mmHg. Pulmonic Valve: The pulmonic valve was normal in structure. Pulmonic  valve regurgitation is not visualized. Aorta: Aortic dilatation noted. There is mild dilatation of the ascending aorta, measuring 40 mm. IAS/Shunts: No atrial level shunt detected by color flow Doppler. Additional Comments: 3D was performed not requiring image post processing on an independent workstation and was indeterminate.  LEFT VENTRICLE PLAX 2D LVIDd:         4.40 cm   Diastology LVIDs:         3.20 cm   LV e' medial:    5.66 cm/s LV PW:         1.30 cm   LV E/e' medial:  17.6 LV IVS:        1.10 cm   LV e' lateral:   10.90 cm/s LVOT diam:     2.20 cm   LV E/e' lateral: 9.1 LV SV:         30 LV SV Index:   15 LVOT Area:     3.80 cm  RIGHT VENTRICLE            IVC RV S prime:     8.21 cm/s  IVC diam: 2.40 cm TAPSE (M-mode): 1.3 cm LEFT ATRIUM             Index        RIGHT ATRIUM           Index LA diam:        4.00 cm 1.96 cm/m   RA Area:     16.00 cm LA Vol (A2C):   51.4 ml 25.13 ml/m  RA Volume:   41.20 ml  20.14 ml/m LA Vol (A4C):   83.8 ml 40.97 ml/m LA Biplane Vol: 68.7 ml 33.59 ml/m  AORTIC VALVE AV Area (Vmax): 2.04 cm AV Vmax:        115.00 cm/s AV Peak Grad:   5.3 mmHg LVOT Vmax:      61.83 cm/s LVOT Vmean:     38.100 cm/s LVOT VTI:       0.078 m  AORTA Ao Root diam: 3.60 cm Ao Asc diam:  4.00 cm MITRAL VALVE               TRICUSPID VALVE MV Area (PHT): 4.80 cm    TR Peak grad:   40.4 mmHg MV Decel Time: 158 msec    TR Vmax:        318.00 cm/s MR Peak grad: 71.7 mmHg MR Vmax:      423.50 cm/s  SHUNTS MV E velocity: 99.60 cm/s  Systemic VTI:  0.08 m                            Systemic Diam: 2.20 cm Cara JONETTA Lovelace MD Electronically signed by Cara JONETTA Lovelace MD Signature Date/Time: 02/18/2024/11:19:30 AM    Final    DG Chest Port 1 View Result Date: 02/17/2024 CLINICAL DATA:  Chest pain EXAM: PORTABLE CHEST 1 VIEW COMPARISON:  None Available. FINDINGS: The cardio pericardial silhouette is enlarged. Vascular congestion with superimposed diffuse interstitial opacity suggests edema. No  acute bony abnormality. Telemetry leads overlie the chest. IMPRESSION: Enlargement of the cardiopericardial silhouette with vascular congestion and diffuse interstitial opacity suggesting edema. Electronically Signed   By: Camellia Candle M.D.   On: 02/17/2024 10:14    ECHO as above  TELEMETRY (personally reviewed): Atrial fibrillation rate 110s  EKG (personally reviewed): AF RVR rate 160 bpm  Data reviewed by me 02/20/2024: last 24h vitals tele labs imaging I/O ED provider note, admission H&P  Principal Problem:   Atrial fibrillation with rapid ventricular response (HCC)    ASSESSMENT AND PLAN:  Nathan Boone is a 78 y.o. male  with a past medical history of hypertension, anemia, anxiety who presented to the ED on 02/17/2024 for SOB, CP. Cardiology was consulted for further evaluation.   # Atrial fibrillation RVR # New onset atrial fibrillation # New onset heart failure, unclear if systolic vs diastolic # Demand ischemia # Hypertension Patient with worsening shortness of breath, abdominal distention over the last week.  Associated mild chest tightness.  BNP significantly elevated at 7094.  Troponins mildly elevated and flat at 480 > 450.  EKG with atrial fibrillation RVR rate 160 bpm. Echo this admission with EF 20-25%, global hypokinesis, mild LVH, mildly reduced RV function, mild LA dilation, moderate MR, mild ascending aorta dilatation 4.0 cm. -Plan for TEE/cardioversion today. Procedure discussed in detail with patient, including risks/benefits. He is amenable to proceeding. Written consent will be obtained.  -Transition to eliquis 5 mg BID.  -Will continue IV lasix at 40 mg BID. -Increase metoprolol  succinate to 25 mg daily as volume status has improved.  -Continue IV amiodarone. Will likely transition to PO later today vs tomorrow. -Suspect mild and flat troponin elevation most consistent with demand/supply mismatch and not ACS in the setting of AF RVR and acute heart  failure.   This patient's plan of care was discussed and created with Dr. Florencio and he is in agreement.  Signed: Danita Bloch, PA-C  02/20/2024, 9:10 AM [facility]

## 2024-02-20 NOTE — H&P (Signed)
 History and Physical Interval Note:  02/20/2024 12:49 PM  Nathan Boone  has presented today for procedure, with the diagnosis of persistent atrial fibrillation.  The various methods of treatment have been discussed with the patient and family. After consideration of risks, benefits and other options for treatment, the patient has consented to  Procedure(s): ECHOCARDIOGRAM, TRANSESOPHAGEAL with cardioversion (N/A) as a diagnostic and therapeutic intervention.  The patient's history has been reviewed, patient examined, no change in status, stable for procedure.  I have reviewed the patient's chart and labs.  Questions were answered to the patient's satisfaction.     Caron [facility]

## 2024-02-20 NOTE — Transfer of Care (Signed)
 Immediate Anesthesia Transfer of Care Note  Patient: Nathan Boone  Procedure(s) Performed: ECHOCARDIOGRAM, TRANSESOPHAGEAL with cardioversion  Patient Location: PACU  Anesthesia Type:General  Level of Consciousness: drowsy and patient cooperative  Airway & Oxygen Therapy: Patient Spontanous Breathing and Patient connected to face mask oxygen  Post-op Assessment: Report given to RN, Post -op Vital signs reviewed and stable, and Patient moving all extremities X 4  Post vital signs: Reviewed and stable  Last Vitals:  Vitals Value Taken Time  BP 85/72 02/20/24 13:27  Temp    Pulse 66 02/20/24 13:30  Resp 15 02/20/24 13:31  SpO2 98 % 02/20/24 13:30    Last Pain:  Vitals:   02/20/24 1154  TempSrc: Oral  PainSc: 0-No pain         Complications: No notable events documented.

## 2024-02-20 NOTE — Anesthesia Postprocedure Evaluation (Signed)
 Anesthesia Post Note  Patient: Nathan Boone  Procedure(s) Performed: ECHOCARDIOGRAM, TRANSESOPHAGEAL with cardioversion  Patient location during evaluation: Specials Recovery Anesthesia Type: General Level of consciousness: awake and alert Pain management: pain level controlled Vital Signs Assessment: post-procedure vital signs reviewed and stable Respiratory status: spontaneous breathing, nonlabored ventilation, respiratory function stable and patient connected to nasal cannula oxygen Cardiovascular status: blood pressure returned to baseline and stable Postop Assessment: no apparent nausea or vomiting Anesthetic complications: no   No notable events documented.   Last Vitals:  Vitals:   02/20/24 1340 02/20/24 1401  BP: 96/61 97/62  Pulse: 61 (!) 58  Resp: (!) 24 (!) 24  Temp: 36.4 C 36.7 C  SpO2: 96% 97%    Last Pain:  Vitals:   02/20/24 1401  TempSrc: Temporal  PainSc: 0-No pain                 Camellia Merilee Louder

## 2024-02-20 NOTE — Progress Notes (Signed)
 PROGRESS NOTE    Nathan Boone  FMW:[id number] DOB: 1945-05-28 DOA: 02/17/2024 PCP: Glover Lenis, MD    Assessment & Plan:   Principal Problem:   Atrial fibrillation with rapid ventricular response (HCC)  Assessment and Plan: A.fib w/ RVR: new onset. S/p IV cardizem and cardizem drip. Cardizem d/c. S/p cardioversion today which was successful, currently NSR. Continue on IV amio & heparin drips as per cardio.  Continue on home dose of metoprolol . Continue on tele. Cardio following and recs apprec.   Acute systolic CHF: echo shows EF 20-25%, diastolic function could not be evaluation, LV demonstrates global hypokinesis, mod MR, .Continue on IV lasix as per cardio. BNP is significantly. Monitor I/Os  Elevated troponin: likely secondary to demand ischemia for a. fib w/ RVR. Cardio following and recs apprec   HTN: continue on home dose of metoprolol     Gout: continue on home dose of allopurinol    AKI: Cr is labile. Avoid nephrotoxic meds        DVT prophylaxis: IV heparin dirp Code Status: full  Family Communication:  discussed pt's care w/ pt's family at bedside and answered their questions  Disposition Plan: depends on PT/OT recs (will consult when appropriate)   Level of care: Stepdown  Status is: Inpatient Remains inpatient appropriate because: severity of illness, requiring IV amio drip & IV heparin drip     Consultants:  Cardio   Procedures:  Antimicrobials:   Subjective: Pt c/o malaise  Objective: Vitals:   02/20/24 0600 02/20/24 0700 02/20/24 0800 02/20/24 0904  BP: 116/89 109/76 105/73 (!) 151/88  Pulse: 98  86   Resp: (!) 21 (!) 23 (!) 24 20  Temp:   97.8 F (36.6 C)   TempSrc:   Oral   SpO2: 97%  97%   Weight:      Height:        Intake/Output Summary (Last 24 hours) at 02/20/2024 0927 Last data filed at 02/20/2024 0848 Gross per 24 hour  Intake 1368.7 ml  Output 2325 ml  Net -956.3 ml   Filed Weights   02/17/24 0942 02/17/24  1321  Weight: 91.6 kg 90.9 kg    Examination:  General exam: appears comfortable   Respiratory system: diminished breath sounds b/l   Cardiovascular system: S1/S2+. No rubs or gallops  Gastrointestinal system: abd is soft, NT, obese, hypoactive bowel sounds  Central nervous system: alert & oriented. Moves all extremities  Psychiatry: judgement and insight appears at baseline. Appropriate mood and affect    Data Reviewed: I have personally reviewed following labs and imaging studies  CBC: Recent Labs  Lab 02/17/24 0957 02/18/24 0238 02/19/24 0215 02/20/24 0402  WBC 7.5 6.1 5.3 4.5  HGB 11.2* 10.0* 10.2* 10.4*  HCT 33.8* 30.7* 30.1* 31.0*  MCV 98.8 98.7 97.4 98.7  PLT 180 177 184 203   Basic Metabolic Panel: Recent Labs  Lab 02/17/24 0956 02/17/24 0957 02/18/24 0238 02/20/24 0402  NA  --  138 138 140  K  --  4.3 4.4 3.6  CL  --  103 102 104  CO2  --  21* 22 26  GLUCOSE  --  116* 117* 101*  BUN  --  37* 41* 40*  CREATININE  --  1.65* 1.89* 1.55*  CALCIUM  --  9.0 8.9 8.8*  MG 2.3  --   --   --    GFR: Estimated Creatinine Clearance: 43.7 mL/min (A) (by C-G formula based on SCr of 1.55 mg/dL (H)). Liver  Function Tests: No results for input(s): AST, ALT, ALKPHOS, BILITOT, PROT, ALBUMIN in the last 168 hours. No results for input(s): LIPASE, AMYLASE in the last 168 hours. No results for input(s): AMMONIA in the last 168 hours. Coagulation Profile: Recent Labs  Lab 02/17/24 1134  INR 1.1   Cardiac Enzymes: No results for input(s): CKTOTAL, CKMB, CKMBINDEX, TROPONINI in the last 168 hours. BNP (last 3 results) Recent Labs    02/17/24 0957  PROBNP 7,094.0*   HbA1C: No results for input(s): HGBA1C in the last 72 hours. CBG: Recent Labs  Lab 02/17/24 1321  GLUCAP 108*   Lipid Profile: No results for input(s): CHOL, HDL, LDLCALC, TRIG, CHOLHDL, LDLDIRECT in the last 72 hours. Thyroid Function Tests: Recent Labs     02/17/24 1134  TSH 2.550   Anemia Panel: No results for input(s): VITAMINB12, FOLATE, FERRITIN, TIBC, IRON, RETICCTPCT in the last 72 hours. Sepsis Labs: No results for input(s): PROCALCITON, LATICACIDVEN in the last 168 hours.  Recent Results (from the past 240 hours)  MRSA Next Gen by PCR, Nasal     Status: None   Collection Time: 02/17/24  1:26 PM   Specimen: Nasal Mucosa; Nasal Swab  Result Value Ref Range Status   MRSA by PCR Next Gen NOT DETECTED NOT DETECTED Final    Comment: (NOTE) The GeneXpert MRSA Assay (FDA approved for NASAL specimens only), is one component of a comprehensive MRSA colonization surveillance program. It is not intended to diagnose MRSA infection nor to guide or monitor treatment for MRSA infections. Test performance is not FDA approved in patients less than 40 years old. Performed at [facility], [address], [place], [place] [address]          Radiology Studies: No results found.       Scheduled Meds:  allopurinol  100 mg Oral Daily   apixaban  5 mg Oral BID   Chlorhexidine  Gluconate Cloth  6 each Topical Daily   furosemide  40 mg Intravenous BID   losartan  25 mg Oral Daily   melatonin  5 mg Oral QHS   [START ON 02/21/2024] metoprolol  succinate  25 mg Oral Daily   mouth rinse  15 mL Mouth Rinse 4 times per day   Continuous Infusions:  sodium chloride  Stopped (02/20/24 0813)   amiodarone 30 mg/hr (02/20/24 9367)     LOS: 3 days       Anthony CHRISTELLA Pouch, MD [facility] Pager [phone]  If 7PM-7AM, please contact night-coverage [email or web address] 02/20/2024, 9:27 AM

## 2024-02-20 NOTE — Anesthesia Preprocedure Evaluation (Addendum)
 Anesthesia Evaluation  Patient identified by MRN, date of birth, ID band Patient awake    Reviewed: Allergy & Precautions, H&P , NPO status , Patient's Chart, lab work & pertinent test results, reviewed documented beta blocker date and time   Airway Mallampati: III  TM Distance: >3 FB Neck ROM: full    Dental  (+) Missing, Chipped   Pulmonary former smoker   Pulmonary exam normal        Cardiovascular Exercise Tolerance: Good hypertension, (-) angina +CHF  Normal cardiovascular exam+ Valvular Problems/Murmurs MR    ECHO 11/25: 1. Left ventricular ejection fraction, by estimation, is 20 to 25%. The  left ventricle has severely decreased function. The left ventricle  demonstrates global hypokinesis. There is mild concentric left ventricular  hypertrophy. Left ventricular diastolic   function could not be evaluated.   2. Right ventricular systolic function is mildly reduced. The right  ventricular size is normal.   3. Left atrial size was mildly dilated.   4. The mitral valve is normal in structure. Moderate mitral valve  regurgitation.   5. The aortic valve is normal in structure. Aortic valve regurgitation is  not visualized.   6. Aortic dilatation noted. There is mild dilatation of the ascending  aorta, measuring 40 mm.     Neuro/Psych negative neurological ROS  negative psych ROS   GI/Hepatic negative GI ROS, Neg liver ROS,,,  Endo/Other  negative endocrine ROS    Renal/GU Renal InsufficiencyRenal disease (AKI)  negative genitourinary   Musculoskeletal   Abdominal  (+) + obese  Peds  Hematology  (+) Blood dyscrasia, anemia   Anesthesia Other Findings # Atrial fibrillation RVR # New onset atrial fibrillation # New onset heart failure, unclear if systolic vs diastolic # Demand ischemia # Hypertension   Past Medical History: No date: Anemia No date: Anxiety No date: Basal cell carcinoma     Comment:   left cheek and left forearm No date: History of shingles No date: Hypertension No date: Osteoarthritis No date: Psoriasis No date: Rhinitis No date: Shingles  Past Surgical History: No date: basal cell carcinoma removal     Comment:  left cheek and left arm, left elbow and right arm No date: CARDIAC CATHETERIZATION No date: CARPAL TUNNEL RELEASE; Right 02/23/2022: CARPAL TUNNEL RELEASE; Right     Comment:  Procedure: CARPAL TUNNEL RELEASE;  Surgeon: Kathlynn Sharper, MD;  Location: [facility];  Service:               Orthopedics;  Laterality: Right; 06/11/2020: CATARACT EXTRACTION W/ INTRAOCULAR LENS IMPLANT; Left 06/25/2020: CATARACT EXTRACTION W/ INTRAOCULAR LENS IMPLANT; Right No date: COLONOSCOPY 01/17/2014: JOINT REPLACEMENT; Right     Comment:  knee No date: KNEE ARTHROSCOPY; Right 08/18/2017: KNEE ARTHROSCOPY WITH MEDIAL MENISECTOMY; Left     Comment:  Procedure: KNEE ARTHROSCOPY WITH PARTIAL MEDIAL AND               LATERAL MENISECTOMY WITH PARTIAL SYNOVECTOMY;  Surgeon:               Kathlynn Sharper, MD;  Location: [facility];  Service:               Orthopedics;  Laterality: Left; 12/15/2020: KNEE CLOSED REDUCTION; Left     Comment:  Procedure: CLOSED MANIPULATION KNEE;  Surgeon: Kathlynn Sharper, MD;  Location: [facility];  Service: Orthopedics;               Laterality: Left; No date: RETINAL LASER PROCEDURE; Right 11/13/2020: TOTAL KNEE ARTHROPLASTY; Left     Comment:  Procedure: TOTAL KNEE ARTHROPLASTY;  Surgeon: Kathlynn Sharper, MD;  Location: [facility];  Service: Orthopedics;               Laterality: Left;  BMI    Body Mass Index: 30.47 kg/m      Reproductive/Obstetrics negative OB ROS                              Anesthesia Physical Anesthesia Plan  ASA: 3  Anesthesia Plan: General   Post-op Pain Management: Minimal or no pain anticipated   Induction: Intravenous  PONV Risk Score and  Plan: Propofol  infusion and TIVA  Airway Management Planned: Natural Airway and Simple Face Mask  Additional Equipment:   Intra-op Plan:   Post-operative Plan:   Informed Consent: I have reviewed the patients History and Physical, chart, labs and discussed the procedure including the risks, benefits and alternatives for the proposed anesthesia with the patient or authorized representative who has indicated his/her understanding and acceptance.     Dental Advisory Given  Plan Discussed with: CRNA and Surgeon  Anesthesia Plan Comments:          Anesthesia Quick Evaluation

## 2024-02-20 NOTE — Consult Note (Signed)
 Pharmacy Consult Note - Anticoagulation  Pharmacy Consult for Heparin drip Indication: atrial fibrillation Allergies  Allergen Reactions   Augmentin [Amoxicillin-Pot Clavulanate] Swelling   Macrolides And Ketolides     Unknown reaction   Oxycodone  Hives   Tetracyclines & Related Rash    Sunburn rash    PATIENT MEASUREMENTS: Height: 5' 8 (172.7 cm) Weight: 90.9 kg (200 lb 6.4 oz) IBW/kg (Calculated) : 68.4 HEPARIN DW (KG): 87.3  VITAL SIGNS: Temp: 98 F (36.7 C) (11/17 0400) Temp Source: Oral (11/17 0400) BP: 100/67 (11/17 0400) Pulse Rate: 94 (11/17 0400)  Recent Labs    02/17/24 1134 02/17/24 2059 02/20/24 0402  HGB  --    < > 10.4*  HCT  --    < > 31.0*  PLT  --    < > 203  APTT 29  --   --   LABPROT 14.4  --   --   INR 1.1  --   --   HEPARINUNFRC  --    < > 0.52  CREATININE  --    < > 1.55*   < > = values in this interval not displayed.    Estimated Creatinine Clearance: 43.7 mL/min (A) (by C-G formula based on SCr of 1.55 mg/dL (H)).  PAST MEDICAL HISTORY: Past Medical History:  Diagnosis Date   Anemia    Anxiety    Basal cell carcinoma    left cheek and left forearm   History of shingles    Hypertension    Osteoarthritis    Psoriasis    Rhinitis    Shingles     Medications:  Medications Prior to Admission  Medication Sig Dispense Refill Last Dose/Taking   allopurinol (ZYLOPRIM) 100 MG tablet Take 100 mg by mouth daily.   02/17/2024   Melatonin 10 MG TABS Take 10 mg by mouth at bedtime as needed (sleep).   02/16/2024   metoprolol  succinate (TOPROL -XL) 50 MG 24 hr tablet Take 25 mg by mouth daily.   02/17/2024   terazosin  (HYTRIN ) 5 MG capsule Take 5 mg by mouth at bedtime.   02/16/2024   Scheduled:   allopurinol  100 mg Oral Daily   Chlorhexidine  Gluconate Cloth  6 each Topical Daily   furosemide  40 mg Intravenous BID   losartan  25 mg Oral Daily   melatonin  5 mg Oral QHS   metoprolol  succinate  12.5 mg Oral Daily   mouth rinse  15 mL  Mouth Rinse 4 times per day   Infusions:   sodium chloride      amiodarone 30 mg/hr (02/20/24 0400)   heparin 1,600 Units/hr (02/20/24 0400)   PRN:   ASSESSMENT: 78 y.o. male with PMH HTN and anemia is presenting with SOB , chest pain, elevated troponin, and new onset A.fib in RVR. Patient is not on chronic anticoagulation per chart review. Pharmacy has been consulted to initiate and manage heparin intravenous infusion.   Goal(s) of therapy: Heparin level 0.3 - 0.7 units/mL aPTT 66 - 102 seconds Monitor platelets by anticoagulation protocol: Yes   Baseline anticoagulation labs: Recent Labs    02/17/24 1134 02/18/24 0238 02/19/24 0215 02/20/24 0402  APTT 29  --   --   --   INR 1.1  --   --   --   HGB  --  10.0* 10.2* 10.4*  PLT  --  177 184 203    11/15 @ 0238 HL 0.38 (Draw 4 hours early by lab)  11/15 @ 1112  HL 0.36, therapeutic x 2 11/16 @ 0215 HL 0.23, subtherapeutic 11/16 @ 1237 HL 0.50, therapeutic x 1 11/16 @ 2052 Hl  0.46, therapeutic x 2 11/17 0402 HL 0.52, therapeutic x 3  PLAN: Continue heparin infusion at 1600 units/hour. Recheck confirmatory HL daily with AM labs Monitor CBC daily while on heparin infusion.  Rankin CANDIE Dills, PharmD, [facility] 02/20/2024 4:50 AM

## 2024-02-20 NOTE — Telephone Encounter (Signed)
 Patient Product/process development scientist completed.    The patient is insured through [facility]. Patient has Medicare and is not eligible for a copay card, but may be able to apply for patient assistance or Medicare RX Payment Plan (Patient Must reach out to their plan, if eligible for payment plan), if available.    Ran test claim for Eliquis  5 mg and the current 30 day co-pay is $45.00.   This test claim was processed through [facility]- copay amounts may vary at other pharmacies due to pharmacy/plan contracts, or as the patient moves through the different stages of their insurance plan.     Reyes Sharps, CPHT Pharmacy Technician Patient Advocate Specialist Lead [facility] Patient Advocate Team Direct Number: [phone]  Fax: [phone]

## 2024-02-21 ENCOUNTER — Other Ambulatory Visit (HOSPITAL_COMMUNITY): Payer: Self-pay

## 2024-02-21 ENCOUNTER — Telehealth (HOSPITAL_COMMUNITY): Payer: Self-pay | Admitting: Pharmacy Technician

## 2024-02-21 DIAGNOSIS — I4891 Unspecified atrial fibrillation: Secondary | ICD-10-CM | POA: Diagnosis not present

## 2024-02-21 LAB — BASIC METABOLIC PANEL WITH GFR
Anion gap: 12 (ref 5–15)
BUN: 36 mg/dL — ABNORMAL HIGH (ref 8–23)
CO2: 29 mmol/L (ref 22–32)
Calcium: 9.4 mg/dL (ref 8.9–10.3)
Chloride: 101 mmol/L (ref 98–111)
Creatinine, Ser: 1.66 mg/dL — ABNORMAL HIGH (ref 0.61–1.24)
GFR, Estimated: 42 mL/min — ABNORMAL LOW (ref >60)
Glucose, Bld: 99 mg/dL (ref 70–99)
Potassium: 3.4 mmol/L — ABNORMAL LOW (ref 3.5–5.1)
Sodium: 141 mmol/L (ref 135–145)

## 2024-02-21 LAB — CBC
HCT: 34 % — ABNORMAL LOW (ref 39.0–52.0)
Hemoglobin: 11.3 g/dL — ABNORMAL LOW (ref 13.0–17.0)
MCH: 32.7 pg (ref 26.0–34.0)
MCHC: 33.2 g/dL (ref 30.0–36.0)
MCV: 98.3 fL (ref 80.0–100.0)
Platelets: 253 K/uL (ref 150–400)
RBC: 3.46 MIL/uL — ABNORMAL LOW (ref 4.22–5.81)
RDW: 12.8 % (ref 11.5–15.5)
WBC: 6.1 K/uL (ref 4.0–10.5)
nRBC: 0 % (ref 0.0–0.2)

## 2024-02-21 MED ORDER — AMIODARONE HCL 200 MG PO TABS: ORAL_TABLET | ORAL | 0 refills | Status: AC

## 2024-02-21 MED ORDER — FUROSEMIDE 40 MG PO TABS: 40.0000 mg | ORAL_TABLET | Freq: Every day | ORAL | 0 refills | Status: AC

## 2024-02-21 MED ORDER — LOSARTAN POTASSIUM 25 MG PO TABS: 25.0000 mg | ORAL_TABLET | Freq: Every day | ORAL | 0 refills | Status: AC

## 2024-02-21 MED ORDER — SPIRONOLACTONE 12.5 MG HALF TABLET
12.5000 mg | ORAL_TABLET | Freq: Every day | ORAL | Status: DC
  Administered 2024-02-21: 12.5 mg via ORAL
  Filled 2024-02-21: qty 1

## 2024-02-21 MED ORDER — SPIRONOLACTONE 25 MG PO TABS: 12.5000 mg | ORAL_TABLET | Freq: Every day | ORAL | 0 refills | Status: AC

## 2024-02-21 MED ORDER — POTASSIUM CHLORIDE CRYS ER 20 MEQ PO TBCR: 40.0000 meq | EXTENDED_RELEASE_TABLET | Freq: Once | ORAL | Status: DC

## 2024-02-21 MED ORDER — FUROSEMIDE 40 MG PO TABS
40.0000 mg | ORAL_TABLET | Freq: Every day | ORAL | Status: DC
  Administered 2024-02-21: 40 mg via ORAL
  Filled 2024-02-21: qty 1

## 2024-02-21 MED ORDER — APIXABAN 5 MG PO TABS: 5.0000 mg | ORAL_TABLET | Freq: Two times a day (BID) | ORAL | 0 refills | Status: AC

## 2024-02-21 NOTE — Evaluation (Signed)
 Occupational Therapy Evaluation Patient Details Name: Nathan Boone MRN: [id number] DOB: Nov 21, 1945 Today's Date: 02/21/2024   History of Present Illness   Nathan Boone is a 78 y.o. male with medical history significant for hypertension, osteoarthritis, psoriasis being admitted to the hospital with progressive dyspnea on exertion found to have new onset rapid A-fib, elevated troponin, and evidence of heart failure.  History is provided by the patient as well as his wife was at the bedside, they state that gradual over the last several weeks he has become progressively more short of breath with exertion.  Denies any chest pain, fevers, cough, nausea, vomiting, extremity edema or significant weight gain.    Clinical Impressions Pt was seen for OT evaluation this date. Prior to hospital admission, pt was independent and working. Pt lives with his spouse who he provides some care for. Pt presents with mild deficits in activity tolerance, affecting safe and optimal ADL completion. Pt currently requires no assist for ADL. Does endorse some mild SOB/exertion with limited activity. Pt educated in home/routines modifications and activity pacing to minimize falls risk and risk of over exertion with gradual improvement in activity tolerance. Pt verbalized understanding and denied additional questions/concerns. Do not anticipate the need for additional follow up OT services. Will sign off.     If plan is discharge home, recommend the following:         Functional Status Assessment   Patient has had a recent decline in their functional status and demonstrates the ability to make significant improvements in function in a reasonable and predictable amount of time.     Equipment Recommendations   None recommended by OT     Recommendations for Other Services         Precautions/Restrictions   Precautions Precautions: Fall Recall of Precautions/Restrictions: Intact Restrictions Weight  Bearing Restrictions Per Provider Order: No     Mobility Bed Mobility               General bed mobility comments: NT, in recliner    Transfers Overall transfer level: Needs assistance Equipment used: None Transfers: Sit to/from Stand Sit to Stand: Supervision                  Balance Overall balance assessment: Modified Independent                                         ADL either performed or assessed with clinical judgement   ADL Overall ADL's : Modified independent                                             Vision         Perception         Praxis         Pertinent Vitals/Pain Pain Assessment Pain Assessment: No/denies pain     Extremity/Trunk Assessment Upper Extremity Assessment Upper Extremity Assessment: Overall WFL for tasks assessed   Lower Extremity Assessment Lower Extremity Assessment: Overall WFL for tasks assessed   Cervical / Trunk Assessment Cervical / Trunk Assessment: Kyphotic   Communication Communication Communication: No apparent difficulties Factors Affecting Communication: Other (comment) (upper partial plate)   Cognition Arousal: Alert Behavior During Therapy: WFL for tasks assessed/performed Cognition: No apparent impairments  Following commands: Intact       Cueing  General Comments   Cueing Techniques: Verbal cues      Exercises Other Exercises Other Exercises: Pt educated in home/routines modifications and activity pacing to minimize falls risk and risk of over exertion with gradual improvement in activity tolerance.   Shoulder Instructions      Home Living Family/patient expects to be discharged to:: Private residence Living Arrangements: Spouse/significant other Available Help at Discharge: Family;Friend(s) Type of Home: House Home Access: Ramped entrance     Home Layout: One level     Bathroom Shower/Tub:  Producer, Television/film/video: Handicapped height Bathroom Accessibility: Yes   Home Equipment: Agricultural Consultant (2 wheels);Cane - quad;Grab bars - toilet;Grab bars - tub/shower;BSC/3in1;Shower seat   Additional Comments: does not use any AD at baseline      Prior Functioning/Environment Prior Level of Function : Independent/Modified Independent;Working/employed;Driving             Mobility Comments: IND with mobility ADLs Comments: IND with all ADLs, IADLs, and provides care for his spouse who is in a wheelchair    OT Problem List: Decreased activity tolerance;Decreased knowledge of use of DME or AE   OT Treatment/Interventions:        OT Goals(Current goals can be found in the care plan section)   Acute Rehab OT Goals Patient Stated Goal: go home OT Goal Formulation: All assessment and education complete, DC therapy   OT Frequency:       Co-evaluation              AM-PAC OT 6 Clicks Daily Activity     Outcome Measure Help from another person eating meals?: None Help from another person taking care of personal grooming?: None Help from another person toileting, which includes using toliet, bedpan, or urinal?: None Help from another person bathing (including washing, rinsing, drying)?: None Help from another person to put on and taking off regular upper body clothing?: None Help from another person to put on and taking off regular lower body clothing?: None 6 Click Score: 24   End of Session    Activity Tolerance: Patient tolerated treatment well Patient left: in chair;with call bell/phone within reach;with chair alarm set  OT Visit Diagnosis: Other abnormalities of gait and mobility (R26.89)                Time: 9065-9056 OT Time Calculation (min): 9 min Charges:  OT General Charges $OT Visit: 1 Visit OT Evaluation $OT Eval Low Complexity: 1 Low  Warren SAUNDERS., MPH, MS, OTR/L ascom [phone] 02/21/24, 10:24 AM

## 2024-02-21 NOTE — Progress Notes (Signed)
 Heart Failure Navigator Progress Note  Assessed for Heart & Vascular TOC clinic readiness.  Patient does not meet criteria due to current [facility] patient.   Navigator will sign off at this time.  Charmaine Pines, RN, BSN [facility] Heart Failure Navigator Secure Chat Only

## 2024-02-21 NOTE — Discharge Summary (Signed)
 Physician Discharge Summary  GAILEN VENNE FMW:[id number] DOB: 1945-05-19 DOA: 02/17/2024  PCP: Glover Lenis, MD  Admit date: 02/17/2024 Discharge date: 02/21/2024  Admitted From: home Disposition:  home   Recommendations for Outpatient Follow-up:  Follow up with PCP in 1-2 weeks F/u w/ cardio, NP Elodie, in 1-2 weeks  Home Health: no  Equipment/Devices:  Discharge Condition: stable  CODE STATUS: full  Diet recommendation: Heart Healthy   Brief/Interim Summary: HPI was taken from Dr. Zella: Nathan Boone is a 78 y.o. male with medical history significant for hypertension, osteoarthritis, psoriasis being admitted to the hospital with progressive dyspnea on exertion found to have new onset rapid A-fib, elevated troponin, and evidence of heart failure.  History is provided by the patient as well as his wife was at the bedside, they state that gradual over the last several weeks he has become progressively more short of breath with exertion.  Denies any chest pain, fevers, cough, nausea, vomiting, extremity edema or significant weight gain.    Discharge Diagnoses:  Principal Problem:   Atrial fibrillation with rapid ventricular response (HCC)  A.fib w/ RVR: new onset. S/p IV cardizem and cardizem drip. Cardizem d/c. S/p cardioversion which was successful, currently NSR. D/c IV amio & heparin drips and started on po amio, eliquis as per cardio. Continue on home dose of metoprolol . Continue on tele. Cardio following and recs apprec.    Acute systolic CHF: echo shows EF 20-25%, diastolic function could not be evaluation, LV demonstrates global hypokinesis, mod MR. Transition to po lasix as per cardio. BNP is significantly. Monitor I/Os   Elevated troponin: likely secondary to demand ischemia for a. fib w/ RVR. Cardio following and recs apprec    HTN: continue on home dose of metoprolol     Gout: continue on home dose of allopurinol    AKI: Cr is labile. Avoid nephrotoxic  meds   Discharge Instructions  Discharge Instructions     Amb referral to [facility]   Complete by: As directed    Diet - low sodium heart healthy   Complete by: As directed    Discharge instructions   Complete by: As directed    F/u w/ PCP in 1-2 weeks. F/u w/ cardio, NP Elodie, in 1-2 weeks   Increase activity slowly   Complete by: As directed       Allergies as of 02/21/2024       Reactions   Augmentin [amoxicillin-pot Clavulanate] Swelling   Macrolides And Ketolides    Unknown reaction   Oxycodone  Hives   Tetracyclines & Related Rash   Sunburn rash        Medication List     TAKE these medications    allopurinol 100 MG tablet Commonly known as: ZYLOPRIM Take 100 mg by mouth daily.   amiodarone 200 MG tablet Commonly known as: PACERONE Take 2 tablets (400 mg total) by mouth 2 (two) times daily for 10 days, THEN 1 tablet (200 mg total) daily for 20 days. Start taking on: February 21, 2024   apixaban 5 MG Tabs tablet Commonly known as: ELIQUIS Take 1 tablet (5 mg total) by mouth 2 (two) times daily.   furosemide 40 MG tablet Commonly known as: LASIX Take 1 tablet (40 mg total) by mouth daily. Start taking on: February 22, 2024   losartan 25 MG tablet Commonly known as: COZAAR Take 1 tablet (25 mg total) by mouth daily. Start taking on: February 22, 2024   Melatonin 10 MG Tabs Take  10 mg by mouth at bedtime as needed (sleep).   metoprolol  succinate 50 MG 24 hr tablet Commonly known as: TOPROL -XL Take 25 mg by mouth daily.   spironolactone 25 MG tablet Commonly known as: ALDACTONE Take 0.5 tablets (12.5 mg total) by mouth daily. Start taking on: February 22, 2024   terazosin  5 MG capsule Commonly known as: HYTRIN  Take 5 mg by mouth at bedtime.        Follow-up Information     Launie Maiden, Ronal Maxwell, NP. Go in 1 week(s).   Specialty: Nurse Practitioner Contact information: [address] [place]  [address] [phone]                Allergies  Allergen Reactions   Augmentin [Amoxicillin-Pot Clavulanate] Swelling   Macrolides And Ketolides     Unknown reaction   Oxycodone  Hives   Tetracyclines & Related Rash    Sunburn rash    Consultations: [facility]    Procedures/Studies: ECHO TEE Result Date: 02/20/2024    TRANSESOPHOGEAL ECHO REPORT   Patient Name:   Nathan Boone Date of Exam: 02/20/2024 Medical Rec #:  [id number]       Height:       68.0 in Accession #:    [id number]      Weight:       200.4 lb Date of Birth:  [birth year]       BSA:          2.045 m Patient Age:    78 years        BP:           116/86 mmHg Patient Gender: M               HR:           104 bpm. Exam Location:  [facility] Procedure: Transesophageal Echo, Color Doppler and Saline Contrast Bubble Study            (Both Spectral and Color Flow Doppler were utilized during            procedure). Indications:     Atrial fibrillation  History:         Patient has prior history of Echocardiogram examinations, most                  recent 02/17/2024. Arrythmias:Atrial Fibrillation.  Sonographer:     Ashley McNeely-Sloane Referring Phys:  [id number] CARALYN HUDSON Diagnosing Phys: Caron Poser PROCEDURE: The transesophogeal probe was passed without difficulty through the esophogus of the patient. Imaged were obtained with the patient in a left lateral decubitus position. Sedation performed by different physician. The patient developed no complications during the procedure. A successful direct current cardioversion was performed at 200 joules with 1 attempt.  IMPRESSIONS  1. Left ventricular ejection fraction, by estimation, is 20 to 25%. The left ventricle has severely decreased function. Beat-to-beat variability in atrial fibrillation.  2. Right ventricular systolic function is moderately reduced. The right ventricular size is normal.  3. No left atrial/left atrial appendage thrombus was detected.  4. The mitral valve is normal  in structure. Mild mitral valve regurgitation. No evidence of mitral stenosis.  5. The aortic valve is tricuspid. Aortic valve regurgitation is not visualized. No aortic stenosis is present.  6. Agitated saline contrast bubble study was negative, with no evidence of any interatrial shunt. Conclusion(s)/Recommendation(s): No LA/LAA thrombus identified. Successful cardioversion performed with restoration of normal sinus rhythm. FINDINGS  Left Ventricle: Left ventricular ejection  fraction, by estimation, is 20 to 25%. The left ventricle has severely decreased function. The left ventricular internal cavity size was normal in size. Right Ventricle: The right ventricular size is normal. Right vetricular wall thickness was not well visualized. Right ventricular systolic function is moderately reduced. Left Atrium: Left atrial size was not assessed. Spontaneous echo contrast was present. No left atrial/left atrial appendage thrombus was detected. Right Atrium: Right atrial size was not assessed. Pericardium: There is no evidence of pericardial effusion. Mitral Valve: The mitral valve is normal in structure. Mild mitral valve regurgitation. No evidence of mitral valve stenosis. Tricuspid Valve: The tricuspid valve is normal in structure. Tricuspid valve regurgitation is mild . No evidence of tricuspid stenosis. Aortic Valve: The aortic valve is tricuspid. Aortic valve regurgitation is not visualized. No aortic stenosis is present. Pulmonic Valve: The pulmonic valve was not assessed. Pulmonic valve regurgitation is not visualized. Aorta: The aortic root is normal in size and structure. There is minimal (Grade I) plaque. IAS/Shunts: No atrial level shunt detected by color flow Doppler. Agitated saline contrast was given intravenously to evaluate for intracardiac shunting. Agitated saline contrast bubble study was negative, with no evidence of any interatrial shunt. Caron Poser Electronically signed by Caron Poser  Signature Date/Time: 02/20/2024/1:43:25 PM    Final    ECHOCARDIOGRAM COMPLETE Result Date: 02/18/2024    ECHOCARDIOGRAM REPORT   Patient Name:   LLOYDE LUDLAM Date of Exam: 02/17/2024 Medical Rec #:  [id number]       Height:       68.0 in Accession #:    [id number]      Weight:       200.4 lb Date of Birth:  05-21-1945       BSA:          2.045 m Patient Age:    78 years        BP:           120/88 mmHg Patient Gender: M               HR:           120 bpm. Exam Location:  [facility] Procedure: 2D Echo, Cardiac Doppler and Color Doppler (Both Spectral and Color            Flow Doppler were utilized during procedure). Indications:     Atrial Fibrillation I48.91  History:         Patient has no prior history of Echocardiogram examinations.                  Arrythmias:Atrial Fibrillation.  Sonographer:     Thea Norlander RCS Referring Phys:  [id number] CARALYN HUDSON Diagnosing Phys: Dwayne D Callwood MD IMPRESSIONS  1. Left ventricular ejection fraction, by estimation, is 20 to 25%. The left ventricle has severely decreased function. The left ventricle demonstrates global hypokinesis. There is mild concentric left ventricular hypertrophy. Left ventricular diastolic  function could not be evaluated.  2. Right ventricular systolic function is mildly reduced. The right ventricular size is normal.  3. Left atrial size was mildly dilated.  4. The mitral valve is normal in structure. Moderate mitral valve regurgitation.  5. The aortic valve is normal in structure. Aortic valve regurgitation is not visualized.  6. Aortic dilatation noted. There is mild dilatation of the ascending aorta, measuring 40 mm. FINDINGS  Left Ventricle: Left ventricular ejection fraction, by estimation, is 20 to 25%. The left ventricle has severely decreased function. The left ventricle  demonstrates global hypokinesis. Strain was performed and the global longitudinal strain is indeterminate. The left ventricular internal cavity size was normal in  size. There is mild concentric left ventricular hypertrophy. Left ventricular diastolic function could not be evaluated. Right Ventricle: The right ventricular size is normal. No increase in right ventricular wall thickness. Right ventricular systolic function is mildly reduced. Left Atrium: Left atrial size was mildly dilated. Right Atrium: Right atrial size was normal in size. Pericardium: There is no evidence of pericardial effusion. Mitral Valve: The mitral valve is normal in structure. Moderate mitral valve regurgitation. Tricuspid Valve: The tricuspid valve is normal in structure. Tricuspid valve regurgitation is trivial. Aortic Valve: The aortic valve is normal in structure. Aortic valve regurgitation is not visualized. Aortic valve peak gradient measures 5.3 mmHg. Pulmonic Valve: The pulmonic valve was normal in structure. Pulmonic valve regurgitation is not visualized. Aorta: Aortic dilatation noted. There is mild dilatation of the ascending aorta, measuring 40 mm. IAS/Shunts: No atrial level shunt detected by color flow Doppler. Additional Comments: 3D was performed not requiring image post processing on an independent workstation and was indeterminate.  LEFT VENTRICLE PLAX 2D LVIDd:         4.40 cm   Diastology LVIDs:         3.20 cm   LV e' medial:    5.66 cm/s LV PW:         1.30 cm   LV E/e' medial:  17.6 LV IVS:        1.10 cm   LV e' lateral:   10.90 cm/s LVOT diam:     2.20 cm   LV E/e' lateral: 9.1 LV SV:         30 LV SV Index:   15 LVOT Area:     3.80 cm  RIGHT VENTRICLE            IVC RV S prime:     8.21 cm/s  IVC diam: 2.40 cm TAPSE (M-mode): 1.3 cm LEFT ATRIUM             Index        RIGHT ATRIUM           Index LA diam:        4.00 cm 1.96 cm/m   RA Area:     16.00 cm LA Vol (A2C):   51.4 ml 25.13 ml/m  RA Volume:   41.20 ml  20.14 ml/m LA Vol (A4C):   83.8 ml 40.97 ml/m LA Biplane Vol: 68.7 ml 33.59 ml/m  AORTIC VALVE AV Area (Vmax): 2.04 cm AV Vmax:        115.00 cm/s AV Peak  Grad:   5.3 mmHg LVOT Vmax:      61.83 cm/s LVOT Vmean:     38.100 cm/s LVOT VTI:       0.078 m  AORTA Ao Root diam: 3.60 cm Ao Asc diam:  4.00 cm MITRAL VALVE               TRICUSPID VALVE MV Area (PHT): 4.80 cm    TR Peak grad:   40.4 mmHg MV Decel Time: 158 msec    TR Vmax:        318.00 cm/s MR Peak grad: 71.7 mmHg MR Vmax:      423.50 cm/s  SHUNTS MV E velocity: 99.60 cm/s  Systemic VTI:  0.08 m  Systemic Diam: 2.20 cm Cara JONETTA Lovelace MD Electronically signed by Cara JONETTA Lovelace MD Signature Date/Time: 02/18/2024/11:19:30 AM    Final    DG Chest Port 1 View Result Date: 02/17/2024 CLINICAL DATA:  Chest pain EXAM: PORTABLE CHEST 1 VIEW COMPARISON:  None Available. FINDINGS: The cardio pericardial silhouette is enlarged. Vascular congestion with superimposed diffuse interstitial opacity suggests edema. No acute bony abnormality. Telemetry leads overlie the chest. IMPRESSION: Enlargement of the cardiopericardial silhouette with vascular congestion and diffuse interstitial opacity suggesting edema. Electronically Signed   By: Camellia Candle M.D.   On: 02/17/2024 10:14   (Echo, Carotid, EGD, Colonoscopy, ERCP)    Subjective: Pt denies any shortness of breath or chest pain    Discharge Exam: Vitals:   02/21/24 0753 02/21/24 1112  BP: 129/73 112/67  Pulse: 65 67  Resp: 18 16  Temp: (!) 97.5 F (36.4 C) 98 F (36.7 C)  SpO2: 97% 100%   Vitals:   02/20/24 2310 02/21/24 0401 02/21/24 0753 02/21/24 1112  BP: 115/72 122/73 129/73 112/67  Pulse: 71 63 65 67  Resp: 18 20 18 16   Temp: 97.7 F (36.5 C) 98.1 F (36.7 C) (!) 97.5 F (36.4 C) 98 F (36.7 C)  TempSrc:      SpO2: 97% 96% 97% 100%  Weight:      Height:        General: Pt is alert, awake, not in acute distress Cardiovascular: S1/S2 +, no rubs, no gallops Respiratory: CTA bilaterally, no wheezing, no rhonchi Abdominal: Soft, NT, obese, bowel sounds + Extremities:  no cyanosis    The results of  significant diagnostics from this hospitalization (including imaging, microbiology, ancillary and laboratory) are listed below for reference.     Microbiology: Recent Results (from the past 240 hours)  MRSA Next Gen by PCR, Nasal     Status: None   Collection Time: 02/17/24  1:26 PM   Specimen: Nasal Mucosa; Nasal Swab  Result Value Ref Range Status   MRSA by PCR Next Gen NOT DETECTED NOT DETECTED Final    Comment: (NOTE) The GeneXpert MRSA Assay (FDA approved for NASAL specimens only), is one component of a comprehensive MRSA colonization surveillance program. It is not intended to diagnose MRSA infection nor to guide or monitor treatment for MRSA infections. Test performance is not FDA approved in patients less than 49 years old. Performed at [facility], [address] [place], [place] [address]      Labs: BNP (last 3 results) No results for input(s): BNP in the last 8760 hours. Basic Metabolic Panel: Recent Labs  Lab 02/17/24 0956 02/17/24 0957 02/18/24 0238 02/20/24 0402 02/21/24 0420  NA  --  138 138 140 141  K  --  4.3 4.4 3.6 3.4*  CL  --  103 102 104 101  CO2  --  21* 22 26 29   GLUCOSE  --  116* 117* 101* 99  BUN  --  37* 41* 40* 36*  CREATININE  --  1.65* 1.89* 1.55* 1.66*  CALCIUM  --  9.0 8.9 8.8* 9.4  MG 2.3  --   --   --   --    Liver Function Tests: No results for input(s): AST, ALT, ALKPHOS, BILITOT, PROT, ALBUMIN in the last 168 hours. No results for input(s): LIPASE, AMYLASE in the last 168 hours. No results for input(s): AMMONIA in the last 168 hours. CBC: Recent Labs  Lab 02/17/24 0957 02/18/24 0238 02/19/24 0215 02/20/24 0402 02/21/24 0420  WBC 7.5 6.1 5.3 4.5 6.1  HGB 11.2* 10.0* 10.2* 10.4* 11.3*  HCT 33.8* 30.7* 30.1* 31.0* 34.0*  MCV 98.8 98.7 97.4 98.7 98.3  PLT 180 177 184 203 253   Cardiac Enzymes: No results for input(s): CKTOTAL, CKMB, CKMBINDEX, TROPONINI in the last 168  hours. BNP: Invalid input(s): POCBNP CBG: Recent Labs  Lab 02/17/24 1321  GLUCAP 108*   D-Dimer No results for input(s): DDIMER in the last 72 hours. Hgb A1c No results for input(s): HGBA1C in the last 72 hours. Lipid Profile No results for input(s): CHOL, HDL, LDLCALC, TRIG, CHOLHDL, LDLDIRECT in the last 72 hours. Thyroid function studies No results for input(s): TSH, T4TOTAL, T3FREE, THYROIDAB in the last 72 hours.  Invalid input(s): FREET3 Anemia work up No results for input(s): VITAMINB12, FOLATE, FERRITIN, TIBC, IRON, RETICCTPCT in the last 72 hours. Urinalysis    Component Value Date/Time   COLORURINE YELLOW 12/14/2020 1751   APPEARANCEUR CLOUDY (A) 12/14/2020 1751   LABSPEC 1.025 12/14/2020 1751   PHURINE 5.5 12/14/2020 1751   GLUCOSEU NEGATIVE 12/14/2020 1751   HGBUR LARGE (A) 12/14/2020 1751   BILIRUBINUR SMALL (A) 12/14/2020 1751   KETONESUR 15 (A) 12/14/2020 1751   PROTEINUR >300 (A) 12/14/2020 1751   NITRITE POSITIVE (A) 12/14/2020 1751   LEUKOCYTESUR MODERATE (A) 12/14/2020 1751   Sepsis Labs Recent Labs  Lab 02/18/24 0238 02/19/24 0215 02/20/24 0402 02/21/24 0420  WBC 6.1 5.3 4.5 6.1   Microbiology Recent Results (from the past 240 hours)  MRSA Next Gen by PCR, Nasal     Status: None   Collection Time: 02/17/24  1:26 PM   Specimen: Nasal Mucosa; Nasal Swab  Result Value Ref Range Status   MRSA by PCR Next Gen NOT DETECTED NOT DETECTED Final    Comment: (NOTE) The GeneXpert MRSA Assay (FDA approved for NASAL specimens only), is one component of a comprehensive MRSA colonization surveillance program. It is not intended to diagnose MRSA infection nor to guide or monitor treatment for MRSA infections. Test performance is not FDA approved in patients less than 93 years old. Performed at [facility], [address] [place], [place] [address]      Time coordinating discharge: 35  minutes  SIGNED:   Anthony CHRISTELLA Pouch, MD  [facility] 02/21/2024, 12:59 PM Pager   If 7PM-7AM, please contact night-coverage [email or web address]

## 2024-02-21 NOTE — Evaluation (Signed)
 Physical Therapy Evaluation Patient Details Name: Nathan Boone MRN: [id number] DOB: Jul 18, 1945 Today's Date: 02/21/2024  History of Present Illness  Nathan Boone is a 78 y.o. male with medical history significant for hypertension, osteoarthritis, psoriasis being admitted to the hospital with progressive dyspnea on exertion found to have new onset rapid A-fib, elevated troponin, and evidence of heart failure.  History is provided by the patient as well as his wife was at the bedside, they state that gradual over the last several weeks he has become progressively more short of breath with exertion.  Denies any chest pain, fevers, cough, nausea, vomiting, extremity edema or significant weight gain.  Clinical Impression  Pt is a very pleasant 78 y.o. male admitted d/t A fib with rapid ventricular response. Pt was received in bed and was alert and eager for PT services. Pt able to complete all bed mobility and a STS with supervision and no vc. He ambulated for 261ft around the nurses station without an AD d/t preference and baseline functional status. Supervision for ambulation. History was collected during ambulation and despite chatting, pt did not exhibit any DOE, CP or LOB. Pt returned to recliner and left with all belongings within reach and chair alarm activated with instructions to call for assistance if he would like to get up. Pt is at his baseline functioning and does not requires further physical therapy services.     If plan is discharge home, recommend the following: A little help with walking and/or transfers   Can travel by private vehicle        Equipment Recommendations None recommended by PT  Recommendations for Other Services       Functional Status Assessment Patient has not had a recent decline in their functional status     Precautions / Restrictions Precautions Precautions: Fall Recall of Precautions/Restrictions: Intact Restrictions Weight Bearing Restrictions  Per Provider Order: No      Mobility  Bed Mobility Overal bed mobility: Needs Assistance Bed Mobility: Supine to Sit     Supine to sit: Supervision     General bed mobility comments: able to complete bed mobility with supervision - no vc    Transfers Overall transfer level: Needs assistance Equipment used: None Transfers: Sit to/from Stand Sit to Stand: Supervision           General transfer comment: able to complete STS without AD and supervision; no LOB or concern for dizziness    Ambulation/Gait Ambulation/Gait assistance: Supervision Gait Distance (Feet): 200 Feet Assistive device: None Gait Pattern/deviations: Trunk flexed, Step-through pattern       General Gait Details: ambulated 200 ft around nurses station without AD; supervision; no SOB despite chatting during ambulation  Stairs            Wheelchair Mobility     Tilt Bed    Modified Rankin (Stroke Patients Only)       Balance Overall balance assessment: Modified Independent                                           Pertinent Vitals/Pain Pain Assessment Pain Assessment: No/denies pain    Home Living Family/patient expects to be discharged to:: Private residence Living Arrangements: Spouse/significant other Available Help at Discharge: Family;Friend(s) Type of Home: House Home Access: Ramped entrance       Home Layout: One level Home Equipment: Agricultural Consultant (2 wheels);Cane -  quad;Grab bars - toilet;Grab bars - tub/shower;BSC/3in1;Shower seat Additional Comments: does not use any AD at baseline    Prior Function Prior Level of Function : Independent/Modified Independent             Mobility Comments: IND with mobility ADLs Comments: IND with all ADLs     Extremity/Trunk Assessment   Upper Extremity Assessment Upper Extremity Assessment: Overall WFL for tasks assessed    Lower Extremity Assessment Lower Extremity Assessment: Overall WFL for  tasks assessed    Cervical / Trunk Assessment Cervical / Trunk Assessment: Kyphotic  Communication   Communication Communication: No apparent difficulties Factors Affecting Communication: Other (comment) (upper partial plate)    Cognition Arousal: Alert Behavior During Therapy: WFL for tasks assessed/performed   PT - Cognitive impairments: No apparent impairments                       PT - Cognition Comments: A&Ox4 Following commands: Intact       Cueing Cueing Techniques: Verbal cues     General Comments      Exercises Other Exercises Other Exercises: edu on quiet standing for a few moments when standing up to avoid walking with dizziness   Assessment/Plan    PT Assessment Patient does not need any further PT services  PT Problem List         PT Treatment Interventions      PT Goals (Current goals can be found in the Care Plan section)  Acute Rehab PT Goals Patient Stated Goal: to return to work PT Goal Formulation: With patient Time For Goal Achievement: 03/06/24 Potential to Achieve Goals: Good    Frequency       Co-evaluation               AM-PAC PT 6 Clicks Mobility  Outcome Measure Help needed turning from your back to your side while in a flat bed without using bedrails?: None Help needed moving from lying on your back to sitting on the side of a flat bed without using bedrails?: None Help needed moving to and from a bed to a chair (including a wheelchair)?: None Help needed standing up from a chair using your arms (e.g., wheelchair or bedside chair)?: None Help needed to walk in hospital room?: None Help needed climbing 3-5 steps with a railing? : A Little 6 Click Score: 23    End of Session Equipment Utilized During Treatment: Gait belt Activity Tolerance: Patient tolerated treatment well Patient left: in chair;with call bell/phone within reach;with chair alarm set Nurse Communication: Mobility status PT Visit Diagnosis:  Other abnormalities of gait and mobility (R26.89)    Time: 9086-9070 PT Time Calculation (min) (ACUTE ONLY): 16 min   Charges:                 Allena Bulls, SPT   Allena Bulls 02/21/2024, 10:09 AM

## 2024-02-21 NOTE — Telephone Encounter (Signed)
 Patient Product/process Development Scientist completed.    The patient is insured through [facility]. Patient has Medicare and is not eligible for a copay card, but may be able to apply for patient assistance or Medicare RX Payment Plan (Patient Must reach out to their plan, if eligible for payment plan), if available.    Ran test claim for Farxiga 10 mg and the current 30 day co-pay is $45.00.  Ran test claim for Jardiance 10 mg and the current 30 day co-pay is $45.00.   This test claim was processed through [facility]- copay amounts may vary at other pharmacies due to pharmacy/plan contracts, or as the patient moves through the different stages of their insurance plan.     Nathan Boone, CPHT Pharmacy Technician Patient Advocate Specialist Lead [facility] Patient Advocate Team Direct Number: [phone]  Fax: [phone]

## 2024-02-21 NOTE — Plan of Care (Signed)
  Problem: Education: Goal: Knowledge of General Education information will improve Description: Including pain rating scale, medication(s)/side effects and non-pharmacologic comfort measures Outcome: Progressing   Problem: Health Behavior/Discharge Planning: Goal: Ability to manage health-related needs will improve Outcome: Progressing   Problem: Clinical Measurements: Goal: Will remain free from infection Outcome: Progressing   Problem: Clinical Measurements: Goal: Respiratory complications will improve Outcome: Progressing   Problem: Clinical Measurements: Goal: Cardiovascular complication will be avoided Outcome: Progressing   Problem: Activity: Goal: Risk for activity intolerance will decrease Outcome: Progressing   Problem: Elimination: Goal: Will not experience complications related to bowel motility Outcome: Progressing   Problem: Elimination: Goal: Will not experience complications related to urinary retention Outcome: Progressing   Problem: Pain Managment: Goal: General experience of comfort will improve and/or be controlled Outcome: Progressing

## 2024-02-21 NOTE — Progress Notes (Addendum)
 [facility] CLINIC CARDIOLOGY PROGRESS NOTE       Patient ID: Nathan Boone MRN: [id number] DOB/AGE: [birth year] 78 y.o.  Admit date: 02/17/2024 Referring Physician Dr. Katha Gail Primary Physician Glover Lenis, MD  Primary Cardiologist None [facility] PCP) Reason for Consultation New onset AF, new HF  HPI: Nathan Boone is a 78 y.o. male  with a past medical history of hypertension, anemia, anxiety who presented to the ED on 02/17/2024 for SOB, CP. Cardiology was consulted for further evaluation.   Interval history: -Patient seen and examined this AM, resting comfortably in hospital bed.  -Reports feeling well today, continues to state his SOB is significantly improved.  -Underwent successful TEE/cardioversion yesterday, remains in NSR.   Review of systems complete and found to be negative unless listed above    Past Medical History:  Diagnosis Date   Anemia    Anxiety    Basal cell carcinoma    left cheek and left forearm   History of shingles    Hypertension    Osteoarthritis    Psoriasis    Rhinitis    Shingles     Past Surgical History:  Procedure Laterality Date   basal cell carcinoma removal     left cheek and left arm, left elbow and right arm   CARDIAC CATHETERIZATION     CARPAL TUNNEL RELEASE Right    CARPAL TUNNEL RELEASE Right 02/23/2022   Procedure: CARPAL TUNNEL RELEASE;  Surgeon: Kathlynn Sharper, MD;  Location: [facility];  Service: Orthopedics;  Laterality: Right;   CATARACT EXTRACTION W/ INTRAOCULAR LENS IMPLANT Left 06/11/2020   CATARACT EXTRACTION W/ INTRAOCULAR LENS IMPLANT Right 06/25/2020   COLONOSCOPY     JOINT REPLACEMENT Right 01/17/2014   knee   KNEE ARTHROSCOPY Right    KNEE ARTHROSCOPY WITH MEDIAL MENISECTOMY Left 08/18/2017   Procedure: KNEE ARTHROSCOPY WITH PARTIAL MEDIAL AND LATERAL MENISECTOMY WITH PARTIAL SYNOVECTOMY;  Surgeon: Kathlynn Sharper, MD;  Location: [facility];  Service: Orthopedics;  Laterality: Left;   KNEE CLOSED  REDUCTION Left 12/15/2020   Procedure: CLOSED MANIPULATION KNEE;  Surgeon: Kathlynn Sharper, MD;  Location: [facility];  Service: Orthopedics;  Laterality: Left;   RETINAL LASER PROCEDURE Right    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   02/17/2024   Melatonin 10 MG TABS Take 10 mg by mouth at bedtime as needed (sleep).   02/16/2024   metoprolol  succinate (TOPROL -XL) 50 MG 24 hr tablet Take 25 mg by mouth daily.   02/17/2024   terazosin  (HYTRIN ) 5 MG capsule Take 5 mg by mouth at bedtime.   02/16/2024   Social History   Socioeconomic History   Marital status: Married    Spouse name: Mary   Number of children: 1   Years of education: Not on file   Highest education level: Not on file  Occupational History   Not on file  Tobacco Use   Smoking status: Former    Current packs/day: 0.00    Types: Cigarettes    Quit date: 1970    Years since quitting: 55.9   Smokeless tobacco: Never  Vaping Use   Vaping status: Never Used  Substance and Sexual Activity   Alcohol use: Yes    Alcohol/week:  11.0 standard drinks of alcohol    Types: 7 Glasses of wine, 4 Cans of beer per week    Comment: Occasional   Drug use: Never   Sexual activity: Not on file  Other Topics Concern   Not on file  Social History Narrative   Not on file   Social Drivers of Health   Financial Resource Strain: Low Risk  (06/10/2023)   Received from [facility]   Overall Financial Resource Strain (CARDIA)    Difficulty of Paying Living Expenses: Not hard at all  Food Insecurity: No Food Insecurity (02/17/2024)   Hunger Vital Sign    Worried About Running Out of Food in the Last Year: Never true    Ran Out of Food in the Last  Year: Never true  Transportation Needs: No Transportation Needs (02/17/2024)   PRAPARE - Administrator, Civil Service (Medical): No    Lack of Transportation (Non-Medical): No  Physical Activity: Not on file  Stress: Not on file  Social Connections: Socially Integrated (02/17/2024)   Social Connection and Isolation Panel    Frequency of Communication with Friends and Family: More than three times a week    Frequency of Social Gatherings with Friends and Family: Twice a week    Attends Religious Services: More than 4 times per year    Active Member of [employer] or Organizations: Yes    Attends Banker Meetings: More than 4 times per year    Marital Status: Married  Catering Manager Violence: Not At Risk (02/17/2024)   Humiliation, Afraid, Rape, and Kick questionnaire    Fear of Current or Ex-Partner: No    Emotionally Abused: No    Physically Abused: No    Sexually Abused: No    History reviewed. No pertinent family history.   Vitals:   02/20/24 2102 02/20/24 2310 02/21/24 0401 02/21/24 0753  BP: 125/76 115/72 122/73 129/73  Pulse:  71 63 65  Resp: 20 18 20 18   Temp: 98 F (36.7 C) 97.7 F (36.5 C) 98.1 F (36.7 C) (!) 97.5 F (36.4 C)  TempSrc: Oral     SpO2: 98% 97% 96% 97%  Weight:      Height:        PHYSICAL EXAM General: Well-appearing elderly male, well nourished, in no acute distress. HEENT: Normocephalic and atraumatic. Neck: No JVD.  Lungs: Normal respiratory effort on 3L Julian. Heart: HRRR. Normal S1 and S2 without gallops or murmurs.  Abdomen: Non-distended appearing.  Msk: Normal strength and tone for age. Extremities: Warm and well perfused. No clubbing, cyanosis.  No edema.  Neuro: Alert and oriented X 3. Psych: Answers questions appropriately.   Labs: Basic Metabolic Panel: Recent Labs    02/20/24 0402 02/21/24 0420  NA 140 141  K 3.6 3.4*  CL 104 101  CO2 26 29  GLUCOSE 101* 99  BUN 40* 36*  CREATININE 1.55* 1.66*   CALCIUM 8.8* 9.4   Liver Function Tests: No results for input(s): AST, ALT, ALKPHOS, BILITOT, PROT, ALBUMIN in the last 72 hours. No results for input(s): LIPASE, AMYLASE in the last 72 hours. CBC: Recent Labs    02/20/24 0402 02/21/24 0420  WBC 4.5 6.1  HGB 10.4* 11.3*  HCT 31.0* 34.0*  MCV 98.7 98.3  PLT 203 253   Cardiac Enzymes: No results for input(s): CKTOTAL, CKMB, CKMBINDEX, TROPONINIHS in the last 72 hours. BNP: No results for input(s): BNP in the last 72 hours. D-Dimer: No  results for input(s): DDIMER in the last 72 hours. Hemoglobin A1C: No results for input(s): HGBA1C in the last 72 hours. Fasting Lipid Panel: No results for input(s): CHOL, HDL, LDLCALC, TRIG, CHOLHDL, LDLDIRECT in the last 72 hours. Thyroid Function Tests: No results for input(s): TSH, T4TOTAL, T3FREE, THYROIDAB in the last 72 hours.  Invalid input(s): FREET3  Anemia Panel: No results for input(s): VITAMINB12, FOLATE, FERRITIN, TIBC, IRON, RETICCTPCT in the last 72 hours.   Radiology: ECHO TEE Result Date: 02/20/2024    TRANSESOPHOGEAL ECHO REPORT   Patient Name:   HOLT WOOLBRIGHT Date of Exam: 02/20/2024 Medical Rec #:  [id number]       Height:       68.0 in Accession #:    [id number]      Weight:       200.4 lb Date of Birth:  08/05/1945       BSA:          2.045 m Patient Age:    24 years        BP:           116/86 mmHg Patient Gender: M               HR:           104 bpm. Exam Location:  [facility] Procedure: Transesophageal Echo, Color Doppler and Saline Contrast Bubble Study            (Both Spectral and Color Flow Doppler were utilized during            procedure). Indications:     Atrial fibrillation  History:         Patient has prior history of Echocardiogram examinations, most                  recent 02/17/2024. Arrythmias:Atrial Fibrillation.  Sonographer:     Ashley McNeely-Sloane Referring Phys:  [id number] Jaculin Rasmus  Diagnosing Phys: Caron Poser PROCEDURE: The transesophogeal probe was passed without difficulty through the esophogus of the patient. Imaged were obtained with the patient in a left lateral decubitus position. Sedation performed by different physician. The patient developed no complications during the procedure. A successful direct current cardioversion was performed at 200 joules with 1 attempt.  IMPRESSIONS  1. Left ventricular ejection fraction, by estimation, is 20 to 25%. The left ventricle has severely decreased function. Beat-to-beat variability in atrial fibrillation.  2. Right ventricular systolic function is moderately reduced. The right ventricular size is normal.  3. No left atrial/left atrial appendage thrombus was detected.  4. The mitral valve is normal in structure. Mild mitral valve regurgitation. No evidence of mitral stenosis.  5. The aortic valve is tricuspid. Aortic valve regurgitation is not visualized. No aortic stenosis is present.  6. Agitated saline contrast bubble study was negative, with no evidence of any interatrial shunt. Conclusion(s)/Recommendation(s): No LA/LAA thrombus identified. Successful cardioversion performed with restoration of normal sinus rhythm. FINDINGS  Left Ventricle: Left ventricular ejection fraction, by estimation, is 20 to 25%. The left ventricle has severely decreased function. The left ventricular internal cavity size was normal in size. Right Ventricle: The right ventricular size is normal. Right vetricular wall thickness was not well visualized. Right ventricular systolic function is moderately reduced. Left Atrium: Left atrial size was not assessed. Spontaneous echo contrast was present. No left atrial/left atrial appendage thrombus was detected. Right Atrium: Right atrial size was not assessed. Pericardium: There is no evidence of pericardial effusion. Mitral Valve: The mitral valve  is normal in structure. Mild mitral valve regurgitation. No evidence of  mitral valve stenosis. Tricuspid Valve: The tricuspid valve is normal in structure. Tricuspid valve regurgitation is mild . No evidence of tricuspid stenosis. Aortic Valve: The aortic valve is tricuspid. Aortic valve regurgitation is not visualized. No aortic stenosis is present. Pulmonic Valve: The pulmonic valve was not assessed. Pulmonic valve regurgitation is not visualized. Aorta: The aortic root is normal in size and structure. There is minimal (Grade I) plaque. IAS/Shunts: No atrial level shunt detected by color flow Doppler. Agitated saline contrast was given intravenously to evaluate for intracardiac shunting. Agitated saline contrast bubble study was negative, with no evidence of any interatrial shunt. Caron Poser Electronically signed by Caron Poser Signature Date/Time: 02/20/2024/1:43:25 PM    Final    ECHOCARDIOGRAM COMPLETE Result Date: 02/18/2024    ECHOCARDIOGRAM REPORT   Patient Name:   Nathan Boone Date of Exam: 02/17/2024 Medical Rec #:  [id number]       Height:       68.0 in Accession #:    [id number]      Weight:       200.4 lb Date of Birth:  June 06, 1945       BSA:          2.045 m Patient Age:    78 years        BP:           120/88 mmHg Patient Gender: M               HR:           120 bpm. Exam Location:  [facility] Procedure: 2D Echo, Cardiac Doppler and Color Doppler (Both Spectral and Color            Flow Doppler were utilized during procedure). Indications:     Atrial Fibrillation I48.91  History:         Patient has no prior history of Echocardiogram examinations.                  Arrythmias:Atrial Fibrillation.  Sonographer:     Thea Norlander RCS Referring Phys:  [id number] Lanelle Lindo Diagnosing Phys: Dwayne D Callwood MD IMPRESSIONS  1. Left ventricular ejection fraction, by estimation, is 20 to 25%. The left ventricle has severely decreased function. The left ventricle demonstrates global hypokinesis. There is mild concentric left ventricular hypertrophy. Left ventricular  diastolic  function could not be evaluated.  2. Right ventricular systolic function is mildly reduced. The right ventricular size is normal.  3. Left atrial size was mildly dilated.  4. The mitral valve is normal in structure. Moderate mitral valve regurgitation.  5. The aortic valve is normal in structure. Aortic valve regurgitation is not visualized.  6. Aortic dilatation noted. There is mild dilatation of the ascending aorta, measuring 40 mm. FINDINGS  Left Ventricle: Left ventricular ejection fraction, by estimation, is 20 to 25%. The left ventricle has severely decreased function. The left ventricle demonstrates global hypokinesis. Strain was performed and the global longitudinal strain is indeterminate. The left ventricular internal cavity size was normal in size. There is mild concentric left ventricular hypertrophy. Left ventricular diastolic function could not be evaluated. Right Ventricle: The right ventricular size is normal. No increase in right ventricular wall thickness. Right ventricular systolic function is mildly reduced. Left Atrium: Left atrial size was mildly dilated. Right Atrium: Right atrial size was normal in size. Pericardium: There is no evidence of pericardial effusion. Mitral Valve: The  mitral valve is normal in structure. Moderate mitral valve regurgitation. Tricuspid Valve: The tricuspid valve is normal in structure. Tricuspid valve regurgitation is trivial. Aortic Valve: The aortic valve is normal in structure. Aortic valve regurgitation is not visualized. Aortic valve peak gradient measures 5.3 mmHg. Pulmonic Valve: The pulmonic valve was normal in structure. Pulmonic valve regurgitation is not visualized. Aorta: Aortic dilatation noted. There is mild dilatation of the ascending aorta, measuring 40 mm. IAS/Shunts: No atrial level shunt detected by color flow Doppler. Additional Comments: 3D was performed not requiring image post processing on an independent workstation and was  indeterminate.  LEFT VENTRICLE PLAX 2D LVIDd:         4.40 cm   Diastology LVIDs:         3.20 cm   LV e' medial:    5.66 cm/s LV PW:         1.30 cm   LV E/e' medial:  17.6 LV IVS:        1.10 cm   LV e' lateral:   10.90 cm/s LVOT diam:     2.20 cm   LV E/e' lateral: 9.1 LV SV:         30 LV SV Index:   15 LVOT Area:     3.80 cm  RIGHT VENTRICLE            IVC RV S prime:     8.21 cm/s  IVC diam: 2.40 cm TAPSE (M-mode): 1.3 cm LEFT ATRIUM             Index        RIGHT ATRIUM           Index LA diam:        4.00 cm 1.96 cm/m   RA Area:     16.00 cm LA Vol (A2C):   51.4 ml 25.13 ml/m  RA Volume:   41.20 ml  20.14 ml/m LA Vol (A4C):   83.8 ml 40.97 ml/m LA Biplane Vol: 68.7 ml 33.59 ml/m  AORTIC VALVE AV Area (Vmax): 2.04 cm AV Vmax:        115.00 cm/s AV Peak Grad:   5.3 mmHg LVOT Vmax:      61.83 cm/s LVOT Vmean:     38.100 cm/s LVOT VTI:       0.078 m  AORTA Ao Root diam: 3.60 cm Ao Asc diam:  4.00 cm MITRAL VALVE               TRICUSPID VALVE MV Area (PHT): 4.80 cm    TR Peak grad:   40.4 mmHg MV Decel Time: 158 msec    TR Vmax:        318.00 cm/s MR Peak grad: 71.7 mmHg MR Vmax:      423.50 cm/s  SHUNTS MV E velocity: 99.60 cm/s  Systemic VTI:  0.08 m                            Systemic Diam: 2.20 cm Cara JONETTA Lovelace MD Electronically signed by Cara JONETTA Lovelace MD Signature Date/Time: 02/18/2024/11:19:30 AM    Final    DG Chest Port 1 View Result Date: 02/17/2024 CLINICAL DATA:  Chest pain EXAM: PORTABLE CHEST 1 VIEW COMPARISON:  None Available. FINDINGS: The cardio pericardial silhouette is enlarged. Vascular congestion with superimposed diffuse interstitial opacity suggests edema. No acute bony abnormality. Telemetry leads overlie the chest. IMPRESSION: Enlargement of the cardiopericardial silhouette with  vascular congestion and diffuse interstitial opacity suggesting edema. Electronically Signed   By: Camellia Candle M.D.   On: 02/17/2024 10:14    ECHO as above  TELEMETRY (personally  reviewed): NSR rate 60s  EKG (personally reviewed): AF RVR rate 160 bpm  Data reviewed by me 02/21/2024: last 24h vitals tele labs imaging I/O ED provider note, admission H&P, hospitalist progress note  Principal Problem:   Atrial fibrillation with rapid ventricular response (HCC)    ASSESSMENT AND PLAN:  Nathan Boone is a 78 y.o. male  with a past medical history of hypertension, anemia, anxiety who presented to the ED on 02/17/2024 for SOB, CP. Cardiology was consulted for further evaluation.   # Atrial fibrillation RVR # New onset atrial fibrillation # New onset heart failure, unclear if systolic vs diastolic # Demand ischemia # Hypertension Patient with worsening shortness of breath, abdominal distention over the last week.  Associated mild chest tightness.  BNP significantly elevated at 7094.  Troponins mildly elevated and flat at 480 > 450.  EKG with atrial fibrillation RVR rate 160 bpm. Echo this admission with EF 20-25%, global hypokinesis, mild LVH, mildly reduced RV function, mild LA dilation, moderate MR, mild ascending aorta dilatation 4.0 cm. S/p successful TEE/cardioversion yesterday. -Continue eliquis 5 mg BID for stroke risk reduction.  -Transition to PO lasix 40 mg daily.  -Start spironolactone 12.5 mg daily. Continue metoprolol  succinate 25 mg daily. Consider SGLT2i outpatient - copay $45/mo for both jardiance and farxiga. -Continue PO amiodarone load with 400 mg BID for 10 days followed by 200 mg daily. -Suspect mild and flat troponin elevation most consistent with demand/supply mismatch and not ACS in the setting of AF RVR and acute heart failure. -Consider ischemic evaluation at outpatient follow up.   Ok for discharge today from a cardiac perspective. Will arrange for follow up in clinic with Tinnie Maiden, NP in 1-2 weeks.    This patient's plan of care was discussed and created with Dr. Florencio and he is in agreement.  Signed: Danita Bloch, PA-C   02/21/2024, 8:20 AM [facility] Cardiology

## 2024-02-21 NOTE — Progress Notes (Signed)
 Heart Failure Stewardship Pharmacy Note  PCP: Glover Lenis, MD PCP-Cardiologist: None  HPI: Nathan Boone is a 78 y.o. male with past medical history of hypertension, anemia, anxiety who presented with to the ED on 02/17/24 with  worsening SOB, abdominal distention, and chest pain over the last week found to have new onset A-fib with RVR, elevated troponin, and evidence of heart failure. EKG on admission with A-fib RVR rate 160 bpm. On admission, proBNP was 7,094. TTE this admission with LV EF 20-25%, mild concentric LVH, mild RV dysfunction, moderate MR. Underwent DCCV 02/20/24.    Pertinent cardiac history:  No significant cardiac history.  Pertinent Lab Values: Creatinine  Date Value Ref Range Status  01/18/2014 0.92 0.60 - 1.30 mg/dL Final   Creatinine, Ser  Date Value Ref Range Status  02/21/2024 1.66 (H) 0.61 - 1.24 mg/dL Final   BUN  Date Value Ref Range Status  02/21/2024 36 (H) 8 - 23 mg/dL Final  89/83/7984 11 7 - 18 mg/dL Final   Potassium  Date Value Ref Range Status  02/21/2024 3.4 (L) 3.5 - 5.1 mmol/L Final  01/18/2014 4.1 3.5 - 5.1 mmol/L Final   Sodium  Date Value Ref Range Status  02/21/2024 141 135 - 145 mmol/L Final  01/18/2014 132 (L) 136 - 145 mmol/L Final   Magnesium  Date Value Ref Range Status  02/17/2024 2.3 1.7 - 2.4 mg/dL Final    Comment:    Performed at [facility], [address] [place], [place] [address]   TSH  Date Value Ref Range Status  02/17/2024 2.550 0.350 - 4.500 uIU/mL Final    Comment:    Performed at [facility], [address] [place], [place] [address]    Vital Signs: Admission weight: 202 lb (91.6 kg) Temp:  [97.5 F (36.4 C)-98.7 F (37.1 C)] 97.5 F (36.4 C) (11/18 0753) Pulse Rate:  [58-142] 65 (11/18 0753) Cardiac Rhythm: Normal sinus rhythm;Bundle branch block (11/17 2057) Resp:  [12-31] 18 (11/18 0753) BP: (84-161)/(52-94) 129/73 (11/18 0753) SpO2:  [94 %-100 %] 97 % (11/18  0753)  Intake/Output Summary (Last 24 hours) at 02/21/2024 1041 Last data filed at 02/21/2024 0900 Gross per 24 hour  Intake 928.44 ml  Output 1100 ml  Net -171.56 ml    Current Heart Failure Medications:  Loop diuretic: furosemide 40 mg daily Beta-Blocker: metoprolol  succinate 25 mg daily ACEI/ARB/ARNI: losartan 25 mg daily MRA: spironolactone 12.5 mg daily SGLT2i: none Other: none  Prior to admission Heart Failure Medications:  Loop diuretic: none Beta-Blocker: metoprolol  succinate 25 mg daily ACEI/ARB/ARNI: none MRA: none SGLT2i: none Other: none  Assessment: 1. 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 NYHA class II symptoms.   -Symptoms: Reports feeling well today. States his SOB is significantly improved. Appetite is good. Ambulated the hall without issues. -Volume: Appears euvolemic. No LEE, JVD, or abdominal distention. Continue furosemide 40 mg PO daily.  -Hemodynamics: BP is normal to mildly elevated. HR 60-70s. NSR. -BB: Continue metoprolol  succinate at 25 mg daily. Can consider titration outpatient.  -ACEI/ARB/ARNI: Continue losartan 25 mg daily. Can consider transition to [facility] outpatient pending BP.  -MRA: Agree with adding spironolactone 12.5 mg daily  -SGLT2i: Renal function improving. Consider starting Jardiance or Farxiga at discharge.  Plan: 1) Medication changes recommended at this time: -None  2) Patient assistance: -Farxiga and Jardiance are $45  3) Education: - Patient has been educated on current HF medications and potential additions  to HF medication regimen - Patient verbalizes understanding that over the next few months, these medication doses may change and more medications may be added to optimize HF regimen - Patient has been educated on basic disease state pathophysiology and goals of therapy  Medication Assistance / Insurance Benefits Check: Does the patient have prescription  insurance?    Please do not hesitate to reach out with questions or concerns,  Jaun Bash, PharmD, CPP, BCPS, [facility] Heart Failure Pharmacist  Phone - [phone] 02/21/2024 10:51 AM

## 2024-02-21 NOTE — Progress Notes (Incomplete)
 Heart Failure Stewardship Pharmacy Note  PCP: Glover Lenis, MD PCP-Cardiologist: None  HPI: Nathan Boone is a 78 y.o. male with past medical history of hypertension, anemia, anxiety who presented with to the ED on 02/17/24 with  worsening SOB, abdominal distention, and chest pain over the last week found to have new onset rapid A-fib, elevated troponin, and evidence of heart failure. EKG with A-fib RVR rate 160 bpm. On admission, BNP was 7094. Echo this admission with EF 20-25%.   Pertinent cardiac history: no hx of afib or chf  Pertinent Lab Values: Creatinine  Date Value Ref Range Status  01/18/2014 0.92 0.60 - 1.30 mg/dL Final   Creatinine, Ser  Date Value Ref Range Status  02/21/2024 1.66 (H) 0.61 - 1.24 mg/dL Final   BUN  Date Value Ref Range Status  02/21/2024 36 (H) 8 - 23 mg/dL Final  89/83/7984 11 7 - 18 mg/dL Final   Potassium  Date Value Ref Range Status  02/21/2024 3.4 (L) 3.5 - 5.1 mmol/L Final  01/18/2014 4.1 3.5 - 5.1 mmol/L Final   Sodium  Date Value Ref Range Status  02/21/2024 141 135 - 145 mmol/L Final  01/18/2014 132 (L) 136 - 145 mmol/L Final   Magnesium  Date Value Ref Range Status  02/17/2024 2.3 1.7 - 2.4 mg/dL Final    Comment:    Performed at [facility], [address] [place], [place] [address]   TSH  Date Value Ref Range Status  02/17/2024 2.550 0.350 - 4.500 uIU/mL Final    Comment:    Performed at [facility], [address] [place], [place] [address]    Vital Signs: Admission weight: 202 lb (91.6 kg) Temp:  [97.5 F (36.4 C)-98.7 F (37.1 C)] 97.5 F (36.4 C) (11/18 0753) Pulse Rate:  [58-142] 65 (11/18 0753) Cardiac Rhythm: Normal sinus rhythm;Bundle branch block (11/17 2057) Resp:  [12-31] 18 (11/18 0753) BP: (84-161)/(52-94) 129/73 (11/18 0753) SpO2:  [94 %-100 %] 97 % (11/18 0753)  Intake/Output Summary (Last 24 hours) at 02/21/2024 0803 Last data filed at 02/21/2024 0454 Gross per 24 hour   Intake 921.72 ml  Output 1600 ml  Net -678.28 ml    Current Heart Failure Medications:  Loop diuretic: furosemide 40 mg daily Beta-Blocker: metoprolol  succinate 25 mg daily ACEI/ARB/ARNI: losartan 25 mg daily MRA: spironolactone 12.5 mg daily SGLT2i: Other:  Prior to admission Heart Failure Medications:  Loop diuretic: Beta-Blocker: metoprolol  succinate 25 mg daily ACEI/ARB/ARNI: MRA: SGLT2i: Other:  Assessment: 1. {CHL AMB Acute or Acute on chronic:210917277} {Systolic/diastolic/combined systolic/diastolic:210917278} heart failure (LVEF ***%) ***, due to ***. NYHA class *** symptoms.   -Symptoms: -Volume: -Hemodynamics: -BB: -ACEI/ARB/ARNI: -MRA: -SGLT2i:  - Plan: 1) Medication changes recommended at this time:  2) Patient assistance:   3) Education: -To be completed prior to discharge.  *** Medication Assistance / Insurance Benefits Check: Does the patient have prescription insurance?    Type of insurance plan:  Does the patient qualify for medication assistance through manufacturers or grants? {CHL AMB Yes/No/Pending:210917269}  Eligible grants and/or patient assistance programs: ***  Medication assistance applications in progress: ***  Medication assistance applications approved: *** Approved medication assistance renewals will be completed by: ***  Outpatient Pharmacy: Prior to admission outpatient pharmacy: ***      ***

## 2024-03-09 ENCOUNTER — Other Ambulatory Visit: Payer: Self-pay | Admitting: Nurse Practitioner

## 2024-03-09 DIAGNOSIS — I5020 Unspecified systolic (congestive) heart failure: Secondary | ICD-10-CM

## 2024-03-12 ENCOUNTER — Other Ambulatory Visit: Payer: Self-pay | Admitting: Cardiology

## 2024-03-12 DIAGNOSIS — I5020 Unspecified systolic (congestive) heart failure: Secondary | ICD-10-CM

## 2024-03-12 NOTE — Progress Notes (Signed)
 Orders only for Cardiac PET stress test consent.   Hamp Levine, PA-C

## 2024-03-15 ENCOUNTER — Ambulatory Visit
Admission: RE | Admit: 2024-03-15 | Discharge: 2024-03-15 | Attending: Nurse Practitioner | Admitting: Nurse Practitioner

## 2024-03-15 DIAGNOSIS — I5020 Unspecified systolic (congestive) heart failure: Secondary | ICD-10-CM

## 2024-03-15 MED ORDER — RUBIDIUM RB82 GENERATOR (RUBYFILL)
25.0000 | PACK | Freq: Once | INTRAVENOUS | Status: AC
  Administered 2024-03-15: 22.37 via INTRAVENOUS

## 2024-03-15 MED ORDER — REGADENOSON 0.4 MG/5ML IV SOLN
0.4000 mg | Freq: Once | INTRAVENOUS | Status: AC
  Administered 2024-03-15: 0.4 mg via INTRAVENOUS
  Filled 2024-03-15: qty 5

## 2024-03-15 MED ORDER — RUBIDIUM RB82 GENERATOR (RUBYFILL)
25.0000 | PACK | Freq: Once | INTRAVENOUS | Status: AC
  Administered 2024-03-15: 22.21 via INTRAVENOUS

## 2024-03-15 MED ORDER — REGADENOSON 0.4 MG/5ML IV SOLN
INTRAVENOUS | Status: AC
  Filled 2024-03-15: qty 5

## 2024-03-16 LAB — NM PET CT CARDIAC PERFUSION MULTI W/ABSOLUTE BLOODFLOW
MBFR: 1.33
Nuc Rest EF: 32 %
Nuc Stress EF: 34 %
Peak HR: 64 {beats}/min
Rest HR: 52 {beats}/min
Rest MBF: 0.6 ml/g/min
Rest Nuclear Isotope Dose: 22.2 mCi
SRS: 0
SSS: 1
ST Depression (mm): 0 mm
Stress MBF: 0.8 ml/g/min
Stress Nuclear Isotope Dose: 22.4 mCi
TID: 1.22
